# Patient Record
Sex: Male | Born: 1990 | Race: Black or African American | Hispanic: No | Marital: Single | Smoking: Current every day smoker
Health system: Southern US, Community
[De-identification: ages and names within clinical notes are randomized; demographics above are authoritative.]

## PROBLEM LIST (undated history)

## (undated) DIAGNOSIS — Z789 Other specified health status: Secondary | ICD-10-CM

## (undated) HISTORY — DX: Other specified health status: Z78.9

## (undated) HISTORY — PX: MOUTH SURGERY: SHX715

---

## 2010-09-12 ENCOUNTER — Emergency Department (HOSPITAL_BASED_OUTPATIENT_CLINIC_OR_DEPARTMENT_OTHER): Admission: EM | Admit: 2010-09-12 | Discharge: 2010-09-12 | Payer: Self-pay | Admitting: Emergency Medicine

## 2010-11-16 ENCOUNTER — Emergency Department (HOSPITAL_BASED_OUTPATIENT_CLINIC_OR_DEPARTMENT_OTHER)
Admission: EM | Admit: 2010-11-16 | Discharge: 2010-11-16 | Payer: Self-pay | Source: Home / Self Care | Admitting: Emergency Medicine

## 2011-08-28 ENCOUNTER — Inpatient Hospital Stay (INDEPENDENT_AMBULATORY_CARE_PROVIDER_SITE_OTHER)
Admission: RE | Admit: 2011-08-28 | Discharge: 2011-08-28 | Disposition: A | Discharge status: Home / Self Care | Payer: Self-pay | Source: Ambulatory Visit | Attending: Emergency Medicine | Admitting: Emergency Medicine

## 2011-08-28 DIAGNOSIS — J019 Acute sinusitis, unspecified: Secondary | ICD-10-CM

## 2011-10-10 ENCOUNTER — Emergency Department (HOSPITAL_BASED_OUTPATIENT_CLINIC_OR_DEPARTMENT_OTHER)
Admission: EM | Admit: 2011-10-10 | Discharge: 2011-10-10 | Disposition: A | Discharge status: Home / Self Care | Payer: Self-pay | Attending: Emergency Medicine | Admitting: Emergency Medicine

## 2011-10-10 DIAGNOSIS — F17200 Nicotine dependence, unspecified, uncomplicated: Secondary | ICD-10-CM | POA: Insufficient documentation

## 2011-10-10 DIAGNOSIS — K047 Periapical abscess without sinus: Secondary | ICD-10-CM | POA: Insufficient documentation

## 2011-10-10 DIAGNOSIS — R220 Localized swelling, mass and lump, head: Secondary | ICD-10-CM | POA: Insufficient documentation

## 2011-10-10 MED ORDER — IBUPROFEN 800 MG PO TABS: 800.0000 mg | ORAL_TABLET | Freq: Three times a day (TID) | ORAL | Status: AC

## 2011-10-10 MED ORDER — PENICILLIN V POTASSIUM 500 MG PO TABS: 500.0000 mg | ORAL_TABLET | Freq: Three times a day (TID) | ORAL | Status: AC

## 2011-10-10 NOTE — ED Provider Notes (Signed)
History     CSN: [id number] Arrival date & time: 10/10/2011  4:06 PM   First MD Initiated Contact with Patient 10/10/11 1607      Chief Complaint  Patient presents with  . Facial Swelling    (Consider location/radiation/quality/duration/timing/severity/associated sxs/prior treatment) HPI Comments: Patient presents with 3 weeks of right jaw swelling which she felt was slightly worse today which is why he presents.  He knows that he has a cavity in one of his right lower teeth and complaints at this intermittently hurts.  He's been taking Tylenol for pain.  No fevers no difficulty with breathing or swallowing.  Patient is a 20 y.o. male presenting with tooth pain.  Dental PainThe primary symptoms include mouth pain. Primary symptoms do not include dental injury, oral bleeding, oral lesions, headaches, fever, shortness of breath, sore throat, angioedema or cough. The symptoms began more than 1 week ago. The symptoms are waxing and waning. The symptoms are new. The symptoms occur intermittently.  Additional symptoms include: jaw pain and facial swelling. Additional symptoms do not include: dental sensitivity to temperature, gum swelling, gum tenderness, purulent gums, trismus, trouble swallowing, pain with swallowing, excessive salivation, dry mouth, taste disturbance, smell disturbance, drooling, ear pain, hearing loss, nosebleeds, swollen glands, goiter and fatigue. Medical issues include: smoking.    History reviewed. No pertinent past medical history.  History reviewed. No pertinent past surgical history.  No family history on file.  History  Substance Use Topics  . Smoking status: Current Everyday Smoker -- 0.5 packs/day    Types: Cigarettes  . Smokeless tobacco: Not on file  . Alcohol Use: No      Review of Systems  Constitutional: Negative.  Negative for fever, chills and fatigue.  HENT: Positive for facial swelling. Negative for hearing loss, ear pain, nosebleeds, sore  throat, drooling and trouble swallowing.   Eyes: Negative.  Negative for discharge and redness.  Respiratory: Negative.  Negative for cough and shortness of breath.   Cardiovascular: Negative.  Negative for chest pain.  Gastrointestinal: Negative.  Negative for nausea, vomiting and abdominal pain.  Genitourinary: Negative.  Negative for hematuria.  Musculoskeletal: Negative.  Negative for back pain.  Skin: Negative.  Negative for color change and rash.  Neurological: Negative for syncope and headaches.  Hematological: Negative.  Negative for adenopathy.  Psychiatric/Behavioral: Negative.  Negative for confusion.  All other systems reviewed and are negative.    Allergies  Review of patient's allergies indicates no known allergies.  Home Medications   Current Outpatient Rx  Name Route Sig Dispense Refill  . ACETAMINOPHEN 500 MG PO TABS Oral Take 1,000 mg by mouth once.        BP 112/67  Pulse 81  Temp(Src) 98.5 F (36.9 C) (Oral)  Resp 16  Ht 5\' 11"  (1.803 m)  Wt 180 lb (81.647 kg)  BMI 25.10 kg/m2  SpO2 100%  Physical Exam  Constitutional: He is oriented to person, place, and time. He appears well-developed and well-nourished.  Non-toxic appearance. He does not have a sickly appearance.  HENT:  Head: Normocephalic and atraumatic.  Mouth/Throat:         Mild right jaw swelling without erythema or warmth.  No fluctuance noted.  No crepitus noted.  Eyes: Conjunctivae, EOM and lids are normal. Pupils are equal, round, and reactive to light.  Neck: Trachea normal, normal range of motion and full passive range of motion without pain. Neck supple.  Cardiovascular: Normal rate, regular rhythm and normal heart sounds.  Pulmonary/Chest: Effort normal and breath sounds normal. No respiratory distress.  Abdominal: Normal appearance. There is no CVA tenderness.  Musculoskeletal: Normal range of motion.  Neurological: He is alert and oriented to person, place, and time. He has  normal strength.  Skin: Skin is warm, dry and intact. No rash noted.  Psychiatric: He has a normal mood and affect. His behavior is normal. Judgment and thought content normal.    ED Course  Procedures (including critical care time)  Labs Reviewed - No data to display No results found.   No diagnosis found.    MDM  Patient appears to have very mild swelling of his right jaw which may be from a early dental infection.  I will place the patient on penicillin and this point in time his pain is not significant to need more than ibuprofen 800 mg by mouth 3 times a day.  Patient has seen a dentist in September for previous dental issue and so has someone to followup.  He understands to return for any worsening swelling or other symptoms.  He has no difficulty with swallowing or breathing at this time.        Nat Christen, MD 10/10/11 1620

## 2011-10-10 NOTE — ED Notes (Signed)
Pt reports right sided facial swelling x 3 weeks and dental pain.

## 2011-11-10 ENCOUNTER — Emergency Department (INDEPENDENT_AMBULATORY_CARE_PROVIDER_SITE_OTHER): Payer: Self-pay

## 2011-11-10 ENCOUNTER — Encounter (HOSPITAL_BASED_OUTPATIENT_CLINIC_OR_DEPARTMENT_OTHER): Payer: Self-pay | Admitting: Family Medicine

## 2011-11-10 ENCOUNTER — Emergency Department (HOSPITAL_BASED_OUTPATIENT_CLINIC_OR_DEPARTMENT_OTHER)
Admission: EM | Admit: 2011-11-10 | Discharge: 2011-11-10 | Disposition: A | Discharge status: Home / Self Care | Payer: Self-pay | Attending: Emergency Medicine | Admitting: Emergency Medicine

## 2011-11-10 ENCOUNTER — Emergency Department (HOSPITAL_BASED_OUTPATIENT_CLINIC_OR_DEPARTMENT_OTHER): Payer: Self-pay

## 2011-11-10 DIAGNOSIS — X58XXXA Exposure to other specified factors, initial encounter: Secondary | ICD-10-CM | POA: Insufficient documentation

## 2011-11-10 DIAGNOSIS — F17200 Nicotine dependence, unspecified, uncomplicated: Secondary | ICD-10-CM | POA: Insufficient documentation

## 2011-11-10 DIAGNOSIS — M25569 Pain in unspecified knee: Secondary | ICD-10-CM

## 2011-11-10 DIAGNOSIS — X500XXA Overexertion from strenuous movement or load, initial encounter: Secondary | ICD-10-CM

## 2011-11-10 DIAGNOSIS — S8990XA Unspecified injury of unspecified lower leg, initial encounter: Secondary | ICD-10-CM | POA: Insufficient documentation

## 2011-11-10 MED ORDER — IBUPROFEN 800 MG PO TABS: 800.0000 mg | ORAL_TABLET | Freq: Three times a day (TID) | ORAL | Status: AC

## 2011-11-10 NOTE — ED Notes (Signed)
Pt states moving boxes last PM and used L knee to help push bos, L medial knee has hurt ever since. Pt repots icing knee last pm, no swelling noted.

## 2011-11-10 NOTE — ED Notes (Signed)
Pt c/o left knee pain since lifting boxes last evening. Pt able to ambulate.

## 2011-11-10 NOTE — ED Notes (Signed)
Patient is resting comfortably. States no worsening and feels less pain in knee since resting.

## 2011-11-10 NOTE — ED Provider Notes (Signed)
History     CSN: [id number] Arrival date & time: 11/10/2011 10:07 AM   First MD Initiated Contact with Patient 11/10/11 1016      Chief Complaint  Patient presents with  . Knee Pain    (Consider location/radiation/quality/duration/timing/severity/associated sxs/prior treatment) HPI Comments: Patient is a 20 year old man who said that yesterday he was moving a box with his left leg. He hurt his knee, and feels pain in the medial side of his left knee. There was no fall. Now when he stands and walks he feels pain on the medial side of the knee and when he makes a pivoting move he feels pain there also. There is no prior history of knee problems.  Patient is a 20 y.o. male presenting with knee pain.  Knee Pain This is a new problem. The current episode started yesterday. The problem occurs constantly. The problem has not changed since onset.The symptoms are aggravated by standing (Pain is worsened by making a pivoting move on his left leg.). He has tried a cold compress for the symptoms. The treatment provided mild relief.    History reviewed. No pertinent past medical history.  History reviewed. No pertinent past surgical history.  History reviewed. No pertinent family history.  History  Substance Use Topics  . Smoking status: Current Everyday Smoker -- 0.5 packs/day    Types: Cigarettes  . Smokeless tobacco: Not on file  . Alcohol Use: No      Review of Systems  All other systems reviewed and are negative.    Allergies  Review of patient's allergies indicates no known allergies.  Home Medications   Current Outpatient Rx  Name Route Sig Dispense Refill  . ACETAMINOPHEN 500 MG PO TABS Oral Take 1,000 mg by mouth once.        Ht 5\' 11"  (1.803 m)  Wt 175 lb (79.379 kg)  BMI 24.41 kg/m2  SpO2 100%  Physical Exam  Constitutional: He is oriented to person, place, and time. He appears well-developed and well-nourished. No distress.  HENT:  Head: Normocephalic.    Neck: Normal range of motion. Neck supple.  Musculoskeletal:       He localizes pain to the medial joint line of the left knee. There is no effusion. No ligamentous instability. There is no palpable bony deformity of the left knee. Range of motion is full left knee is full. He has intact pulses sensation and tendon function in the left foot.  Neurological: He is alert and oriented to person, place, and time.       No sensory or motor deficit.  Skin: Skin is warm and dry.  Psychiatric: He has a normal mood and affect. His behavior is normal.    ED Course  Procedures (including critical care time)   10:32 AM Patient was seen and had physical examination. X-ray left knee was ordered.   1. Knee injury           Carleene Cooper III, MD 11/10/11 2108

## 2012-01-15 ENCOUNTER — Emergency Department (HOSPITAL_BASED_OUTPATIENT_CLINIC_OR_DEPARTMENT_OTHER)
Admission: EM | Admit: 2012-01-15 | Discharge: 2012-01-15 | Disposition: A | Discharge status: Home Health | Payer: Self-pay | Attending: Emergency Medicine | Admitting: Emergency Medicine

## 2012-01-15 ENCOUNTER — Encounter (HOSPITAL_BASED_OUTPATIENT_CLINIC_OR_DEPARTMENT_OTHER): Payer: Self-pay | Admitting: Emergency Medicine

## 2012-01-15 DIAGNOSIS — R110 Nausea: Secondary | ICD-10-CM | POA: Insufficient documentation

## 2012-01-15 DIAGNOSIS — R51 Headache: Secondary | ICD-10-CM | POA: Insufficient documentation

## 2012-01-15 DIAGNOSIS — J069 Acute upper respiratory infection, unspecified: Secondary | ICD-10-CM | POA: Insufficient documentation

## 2012-01-15 DIAGNOSIS — F17200 Nicotine dependence, unspecified, uncomplicated: Secondary | ICD-10-CM | POA: Insufficient documentation

## 2012-01-15 MED ORDER — ONDANSETRON 4 MG PO TBDP: 4.0000 mg | ORAL_TABLET | Freq: Three times a day (TID) | ORAL | Status: AC | PRN

## 2012-01-15 NOTE — ED Notes (Signed)
Pt here for sinus pressure, pain around eyes, cough, and chills. Pt states he has also had nausea without vomiting.

## 2012-01-15 NOTE — ED Provider Notes (Signed)
Medical screening examination/treatment/procedure(s) were performed by non-physician practitioner and as supervising physician I was immediately available for consultation/collaboration.  Hurman Horn, MD 01/15/12 2236

## 2012-01-15 NOTE — ED Notes (Signed)
Pt c/o nausea and sinus pain x several days

## 2012-01-15 NOTE — ED Provider Notes (Signed)
History     CSN: [id number]  Arrival date & time 01/15/12  2046   First MD Initiated Contact with Patient 01/15/12 2101      Chief Complaint  Patient presents with  . Facial Pain  . Nausea    (Consider location/radiation/quality/duration/timing/severity/associated sxs/prior treatment) Patient is a 21 y.o. male presenting with URI. The history is provided by the patient. No language interpreter was used.  URI The primary symptoms include headaches and cough. Primary symptoms do not include fever. The current episode started 3 to 5 days ago. This is a new problem. The problem has not changed since onset. Symptoms associated with the illness include facial pain and congestion.    No past medical history on file.  History reviewed. No pertinent past surgical history.  No family history on file.  History  Substance Use Topics  . Smoking status: Current Everyday Smoker -- 0.5 packs/day    Types: Cigarettes  . Smokeless tobacco: Not on file  . Alcohol Use: No      Review of Systems  Constitutional: Negative for fever.  HENT: Positive for congestion.   Respiratory: Positive for cough.   Neurological: Positive for headaches.    Allergies  Review of patient's allergies indicates no known allergies.  Home Medications   Current Outpatient Rx  Name Route Sig Dispense Refill  . ACETAMINOPHEN 500 MG PO TABS Oral Take 1,000 mg by mouth once.      Marland Kitchen ONDANSETRON 4 MG PO TBDP Oral Take 1 tablet (4 mg total) by mouth every 8 (eight) hours as needed for nausea. 20 tablet 0    BP 116/70  Pulse 102  Temp(Src) 98.9 F (37.2 C) (Oral)  Resp 18  Ht 6' (1.829 m)  Wt 175 lb (79.379 kg)  BMI 23.73 kg/m2  SpO2 100%  Physical Exam  Nursing note and vitals reviewed. Constitutional: He is oriented to person, place, and time. He appears well-developed and well-nourished.  HENT:  Head: Normocephalic and atraumatic.  Right Ear: External ear normal.  Left Ear: External ear normal.    Nose: Rhinorrhea present.  Eyes: EOM are normal.  Neck: Neck supple.  Cardiovascular: Normal rate and regular rhythm.   Pulmonary/Chest: Effort normal and breath sounds normal.  Neurological: He is alert and oriented to person, place, and time.  Skin: Skin is warm and dry.  Psychiatric: He has a normal mood and affect.    ED Course  Procedures (including critical care time)  Labs Reviewed - No data to display No results found.   1. URI (upper respiratory infection)   2. Nausea       MDM  Don't thing pt needs antibiotics at this time        Teressa Lower, NP 01/15/12 2113

## 2012-10-03 ENCOUNTER — Emergency Department (HOSPITAL_BASED_OUTPATIENT_CLINIC_OR_DEPARTMENT_OTHER)
Admission: EM | Admit: 2012-10-03 | Discharge: 2012-10-03 | Disposition: A | Discharge status: Home / Self Care | Payer: Self-pay | Attending: Emergency Medicine | Admitting: Emergency Medicine

## 2012-10-03 ENCOUNTER — Encounter (HOSPITAL_BASED_OUTPATIENT_CLINIC_OR_DEPARTMENT_OTHER): Payer: Self-pay | Admitting: *Deleted

## 2012-10-03 ENCOUNTER — Emergency Department (HOSPITAL_BASED_OUTPATIENT_CLINIC_OR_DEPARTMENT_OTHER): Payer: Self-pay

## 2012-10-03 DIAGNOSIS — S61439A Puncture wound without foreign body of unspecified hand, initial encounter: Secondary | ICD-10-CM

## 2012-10-03 DIAGNOSIS — W1789XA Other fall from one level to another, initial encounter: Secondary | ICD-10-CM | POA: Insufficient documentation

## 2012-10-03 DIAGNOSIS — Y929 Unspecified place or not applicable: Secondary | ICD-10-CM | POA: Insufficient documentation

## 2012-10-03 DIAGNOSIS — F17200 Nicotine dependence, unspecified, uncomplicated: Secondary | ICD-10-CM | POA: Insufficient documentation

## 2012-10-03 DIAGNOSIS — S61409A Unspecified open wound of unspecified hand, initial encounter: Secondary | ICD-10-CM | POA: Insufficient documentation

## 2012-10-03 DIAGNOSIS — Y9301 Activity, walking, marching and hiking: Secondary | ICD-10-CM | POA: Insufficient documentation

## 2012-10-03 MED ORDER — AMOXICILLIN-POT CLAVULANATE 875-125 MG PO TABS: 1.0000 | ORAL_TABLET | Freq: Two times a day (BID) | ORAL | Status: DC

## 2012-10-03 MED ORDER — AMOXICILLIN-POT CLAVULANATE 875-125 MG PO TABS
1.0000 | ORAL_TABLET | Freq: Once | ORAL | Status: AC
  Administered 2012-10-03: 1 via ORAL
  Filled 2012-10-03: qty 1

## 2012-10-03 NOTE — ED Notes (Signed)
Tripped caught self with right hand states painful and limited movement

## 2012-10-03 NOTE — ED Notes (Signed)
MD at bedside. 

## 2012-10-03 NOTE — ED Provider Notes (Signed)
History     CSN: [id number]  Arrival date & time 10/03/12  5784   First MD Initiated Contact with Patient 10/03/12 [phone]      Chief Complaint  Patient presents with  . Hand Pain    (Consider location/radiation/quality/duration/timing/severity/associated sxs/prior treatment) HPI Pt reports he tripped and fell yesterday walking on some wooden steps. He landed on a nail which punctured the palm of his R hand. Pt did not initially have any pain, but today reports moderate aching pain worse with movement of his R 4th finger.   History reviewed. No pertinent past medical history.  History reviewed. No pertinent past surgical history.  History reviewed. No pertinent family history.  History  Substance Use Topics  . Smoking status: Current Every Day Smoker -- 0.5 packs/day    Types: Cigarettes  . Smokeless tobacco: Not on file  . Alcohol Use: No      Review of Systems All other systems reviewed and are negative except as noted in HPI.   Allergies  Review of patient's allergies indicates no known allergies.  Home Medications   Current Outpatient Rx  Name Route Sig Dispense Refill  . ACETAMINOPHEN 500 MG PO TABS Oral Take 1,000 mg by mouth once.        BP 133/80  Pulse 78  Temp 97.8 F (36.6 C) (Oral)  Resp 20  Ht 5\' 11"  (1.803 m)  Wt 160 lb (72.576 kg)  BMI 22.32 kg/m2  SpO2 99%  Physical Exam  Constitutional: He is oriented to person, place, and time. He appears well-developed and well-nourished.  HENT:  Head: Normocephalic and atraumatic.  Neck: Neck supple.  Pulmonary/Chest: Effort normal.  Musculoskeletal: Normal range of motion. He exhibits tenderness.       Soft tissue tenderness over the R palm at the site of a puncture over the distal head of the 4th metacarpal. No snuffbox tenderness, no significant swelling, erythema or drainage  Neurological: He is alert and oriented to person, place, and time. No cranial nerve deficit.  Psychiatric: He has a normal  mood and affect. His behavior is normal.    ED Course  Procedures (including critical care time)  Labs Reviewed - No data to display Dg Hand Complete Right  10/03/2012  *RADIOLOGY REPORT*  Clinical Data: Stuck in palm of hand with a nail  RIGHT HAND - COMPLETE 3+ VIEW  Comparison: None.  Findings: No fracture or dislocation is seen.  The joint spaces are preserved.  The visualized soft tissues are unremarkable.  No radiopaque foreign body is seen.  IMPRESSION: No fracture, dislocation, or radiopaque foreign body is seen.   Original Report Authenticated By: Charline Bills, M.D.      No diagnosis found.    MDM  Xray neg for foreign body as above. Pt declines pain medication. Wound too remote and already closed, cannot irrigate in the ED. Will Rx Abx and advised Hand followup.         Charles B. Bernette Mayers, MD 10/03/12 [phone]

## 2012-11-03 ENCOUNTER — Emergency Department (HOSPITAL_BASED_OUTPATIENT_CLINIC_OR_DEPARTMENT_OTHER)
Admission: EM | Admit: 2012-11-03 | Discharge: 2012-11-03 | Disposition: A | Discharge status: Home / Self Care | Payer: Self-pay | Attending: Emergency Medicine | Admitting: Emergency Medicine

## 2012-11-03 ENCOUNTER — Encounter (HOSPITAL_BASED_OUTPATIENT_CLINIC_OR_DEPARTMENT_OTHER): Payer: Self-pay | Admitting: Emergency Medicine

## 2012-11-03 DIAGNOSIS — R197 Diarrhea, unspecified: Secondary | ICD-10-CM | POA: Insufficient documentation

## 2012-11-03 DIAGNOSIS — F17200 Nicotine dependence, unspecified, uncomplicated: Secondary | ICD-10-CM | POA: Insufficient documentation

## 2012-11-03 DIAGNOSIS — A084 Viral intestinal infection, unspecified: Secondary | ICD-10-CM

## 2012-11-03 DIAGNOSIS — A088 Other specified intestinal infections: Secondary | ICD-10-CM | POA: Insufficient documentation

## 2012-11-03 LAB — URINALYSIS, ROUTINE W REFLEX MICROSCOPIC
Bilirubin Urine: NEGATIVE
Nitrite: NEGATIVE
Specific Gravity, Urine: 1.019 (ref 1.005–1.030)
pH: 6 (ref 5.0–8.0)

## 2012-11-03 NOTE — ED Provider Notes (Signed)
Medical screening examination/treatment/procedure(s) were performed by non-physician practitioner and as supervising physician I was immediately available for consultation/collaboration.  Geoffery Lyons, MD 11/03/12 [phone]

## 2012-11-03 NOTE — ED Notes (Signed)
Patient was able to keep fluid and crackers down w/o vomiting

## 2012-11-03 NOTE — ED Notes (Signed)
Pt having N/V/D since Thursday.

## 2012-11-03 NOTE — ED Provider Notes (Signed)
History     CSN: [id number]  Arrival date & time 11/03/12  1217   First MD Initiated Contact with Patient 11/03/12 1332      Chief Complaint  Patient presents with  . Emesis  . Diarrhea    (Consider location/radiation/quality/duration/timing/severity/associated sxs/prior treatment) HPI Comments: 21 year old male presents to the emergency department complaining of nausea, vomiting and diarrhea beginning on Friday night. States he drank more so than normal on Friday, and since then his stomach has felt "gassy". He had a minor stomach ache which Pepto-Bismol provided complete relief. States his vomiting and diarrhea are not constant. He has not had a normal bowel movement since Friday. He was able to keep toast and a pear down this morning. Denies fever or chills. Currently not nauseated.  Patient is a 21 y.o. male presenting with vomiting and diarrhea. The history is provided by the patient.  Emesis  Associated symptoms include diarrhea. Pertinent negatives include no abdominal pain, no chills, no fever and no headaches.  Diarrhea The primary symptoms include vomiting and diarrhea. Primary symptoms do not include fever, abdominal pain or nausea.  The illness does not include chills or back pain.    No past medical history on file.  No past surgical history on file.  No family history on file.  History  Substance Use Topics  . Smoking status: Current Every Day Smoker -- 0.5 packs/day    Types: Cigarettes  . Smokeless tobacco: Not on file  . Alcohol Use: No      Review of Systems  Constitutional: Negative for fever, chills and appetite change.  HENT: Negative.   Respiratory: Negative for shortness of breath.   Cardiovascular: Negative for chest pain.  Gastrointestinal: Positive for vomiting and diarrhea. Negative for nausea and abdominal pain.  Genitourinary: Negative.   Musculoskeletal: Negative for back pain.  Skin: Negative.   Neurological: Negative for dizziness,  light-headedness and headaches.  Psychiatric/Behavioral: Negative for confusion.    Allergies  Review of patient's allergies indicates no known allergies.  Home Medications  No current outpatient prescriptions on file.  BP 101/65  Pulse 80  Temp 97.9 F (36.6 C) (Oral)  Resp 18  SpO2 100%  Physical Exam  Nursing note and vitals reviewed. Constitutional: He is oriented to person, place, and time. He appears well-developed and well-nourished. No distress.       Appears comfortable.   HENT:  Head: Normocephalic and atraumatic.  Mouth/Throat: Oropharynx is clear and moist.  Eyes: Conjunctivae normal are normal.  Neck: Normal range of motion. Neck supple.  Cardiovascular: Normal rate, regular rhythm, normal heart sounds and intact distal pulses.   Pulmonary/Chest: Effort normal and breath sounds normal.  Abdominal: Soft. Bowel sounds are normal. There is no tenderness.  Musculoskeletal: Normal range of motion. He exhibits no edema.  Neurological: He is alert and oriented to person, place, and time.  Skin: Skin is warm and dry.  Psychiatric: He has a normal mood and affect. His behavior is normal.    ED Course  Procedures (including critical care time)   Labs Reviewed  URINALYSIS, ROUTINE W REFLEX MICROSCOPIC   No results found.   1. Viral gastroenteritis       MDM  Your viral gastroenteritis. Currently he room in no distress appearing completely comfortable. Currently not nauseated no abdominal pain or tenderness on exam. He was able to eat crackers and drink soda without any difficulty. Discussed BRAT diet. Stable for discharge.        Nada Boozer  Mathis Fare, PA-C 11/03/12 1441

## 2013-02-13 ENCOUNTER — Encounter (HOSPITAL_BASED_OUTPATIENT_CLINIC_OR_DEPARTMENT_OTHER): Payer: Self-pay | Admitting: *Deleted

## 2013-02-13 ENCOUNTER — Emergency Department (HOSPITAL_BASED_OUTPATIENT_CLINIC_OR_DEPARTMENT_OTHER)
Admission: EM | Admit: 2013-02-13 | Discharge: 2013-02-13 | Disposition: A | Discharge status: Home / Self Care | Payer: BC Managed Care – PPO | Attending: Emergency Medicine | Admitting: Emergency Medicine

## 2013-02-13 ENCOUNTER — Emergency Department (HOSPITAL_BASED_OUTPATIENT_CLINIC_OR_DEPARTMENT_OTHER): Payer: BC Managed Care – PPO

## 2013-02-13 DIAGNOSIS — Y929 Unspecified place or not applicable: Secondary | ICD-10-CM | POA: Insufficient documentation

## 2013-02-13 DIAGNOSIS — X58XXXA Exposure to other specified factors, initial encounter: Secondary | ICD-10-CM | POA: Insufficient documentation

## 2013-02-13 DIAGNOSIS — Y9389 Activity, other specified: Secondary | ICD-10-CM | POA: Insufficient documentation

## 2013-02-13 DIAGNOSIS — S91332A Puncture wound without foreign body, left foot, initial encounter: Secondary | ICD-10-CM

## 2013-02-13 DIAGNOSIS — F17200 Nicotine dependence, unspecified, uncomplicated: Secondary | ICD-10-CM | POA: Insufficient documentation

## 2013-02-13 DIAGNOSIS — S91309A Unspecified open wound, unspecified foot, initial encounter: Secondary | ICD-10-CM | POA: Insufficient documentation

## 2013-02-13 MED ORDER — AMOXICILLIN-POT CLAVULANATE 875-125 MG PO TABS: 1.0000 | ORAL_TABLET | Freq: Two times a day (BID) | ORAL | Status: DC

## 2013-02-13 MED ORDER — HYDROCODONE-ACETAMINOPHEN 5-325 MG PO TABS: 2.0000 | ORAL_TABLET | ORAL | Status: DC | PRN

## 2013-02-13 MED ORDER — AMOXICILLIN-POT CLAVULANATE 875-125 MG PO TABS
1.0000 | ORAL_TABLET | Freq: Once | ORAL | Status: AC
  Administered 2013-02-13: 1 via ORAL
  Filled 2013-02-13: qty 1

## 2013-02-13 NOTE — ED Notes (Signed)
Left foot injury. He stepped on a Christmas tree stand last night. Pain since.

## 2013-02-13 NOTE — ED Provider Notes (Signed)
History     CSN: [id number]  Arrival date & time 02/13/13  1444   First MD Initiated Contact with Patient 02/13/13 1502      Chief Complaint  Patient presents with  . Foot Injury    (Consider location/radiation/quality/duration/timing/severity/associated sxs/prior treatment) HPI Comments: Patient sustained a puncture wound to left foot last night. He stepped on a spike Christmas tree stand. He was wearing a shoe and sock at that time. He's had pain since. Denies any weakness, numbness or tingling. Denies any bleeding or drainage. Last tetanus shot 2010.  The history is provided by the patient.    History reviewed. No pertinent past medical history.  History reviewed. No pertinent past surgical history.  No family history on file.  History  Substance Use Topics  . Smoking status: Current Every Day Smoker -- 1.00 packs/day    Types: Cigarettes  . Smokeless tobacco: Not on file  . Alcohol Use: No      Review of Systems  Constitutional: Negative for activity change and appetite change.  Respiratory: Negative for chest tightness and shortness of breath.   Gastrointestinal: Negative for nausea, vomiting and abdominal pain.  Musculoskeletal: Negative for back pain.  Skin: Positive for wound.  Neurological: Negative for headaches.  A complete 10 system review of systems was obtained and all systems are negative except as noted in the HPI and PMH.    Allergies  Review of patient's allergies indicates no known allergies.  Home Medications   Current Outpatient Rx  Name  Route  Sig  Dispense  Refill  . amoxicillin-clavulanate (AUGMENTIN) 875-125 MG per tablet   Oral   Take 1 tablet by mouth every 12 (twelve) hours.   14 tablet   0   . HYDROcodone-acetaminophen (NORCO/VICODIN) 5-325 MG per tablet   Oral   Take 2 tablets by mouth every 4 (four) hours as needed for pain.   10 tablet   0     BP 116/63  Pulse 86  Temp(Src) 98.4 F (36.9 C) (Oral)  Resp 18  Wt  160 lb (72.576 kg)  BMI 22.33 kg/m2  SpO2 99%  Physical Exam  Constitutional: He is oriented to person, place, and time. He appears well-developed and well-nourished. No distress.  Cardiovascular: Normal rate, regular rhythm and normal heart sounds.   No murmur heard. Pulmonary/Chest: Effort normal and breath sounds normal. No respiratory distress.  Abdominal: Soft. There is no tenderness. There is no rebound and no guarding.  Musculoskeletal: Normal range of motion. He exhibits no edema and no tenderness.  Puncture wound to sole of L foot. Does not penetrate dorsal surface +2 DP and PT pulse. Able to wiggle toes. Sensation intact.    Neurological: He is alert and oriented to person, place, and time. No cranial nerve deficit. He exhibits normal muscle tone. Coordination normal.  Skin: Skin is warm.    ED Course  Procedures (including critical care time)  Labs Reviewed - No data to display Dg Foot Complete Left  02/13/2013  *RADIOLOGY REPORT*  Clinical Data: Foot injury.  LEFT FOOT - COMPLETE 3+ VIEW  Comparison: None.  Findings: No acute bony abnormality.  Specifically, no fracture, subluxation, or dislocation.  Soft tissues are intact. Joint spaces are maintained.  Normal bone mineralization. No radiopaque foreign body.  IMPRESSION: No bony abnormality.   Original Report Authenticated By: Charlett Nose, M.D.      1. Puncture wound of foot, left, initial encounter       MDM  Puncture  wound to sole of left foot since last night. Neurovascularly intact. Tetanus up-to-date.  Obtain x-ray to rule out foreign body. Clean wound. Empiric antibiotics.  X-ray negative for foreign body or fracture. Patient given postop shoe, antibiotics, pain medication, wound care instructions. Return precautions discussed.      Glynn Octave, MD 02/13/13 [phone]

## 2013-06-09 ENCOUNTER — Emergency Department (HOSPITAL_BASED_OUTPATIENT_CLINIC_OR_DEPARTMENT_OTHER)
Admission: EM | Admit: 2013-06-09 | Discharge: 2013-06-09 | Disposition: A | Discharge status: Home / Self Care | Payer: Self-pay | Attending: Emergency Medicine | Admitting: Emergency Medicine

## 2013-06-09 ENCOUNTER — Encounter (HOSPITAL_BASED_OUTPATIENT_CLINIC_OR_DEPARTMENT_OTHER): Payer: Self-pay | Admitting: *Deleted

## 2013-06-09 ENCOUNTER — Emergency Department (HOSPITAL_BASED_OUTPATIENT_CLINIC_OR_DEPARTMENT_OTHER): Payer: Self-pay

## 2013-06-09 DIAGNOSIS — M25569 Pain in unspecified knee: Secondary | ICD-10-CM | POA: Insufficient documentation

## 2013-06-09 DIAGNOSIS — F17200 Nicotine dependence, unspecified, uncomplicated: Secondary | ICD-10-CM | POA: Insufficient documentation

## 2013-06-09 DIAGNOSIS — M25561 Pain in right knee: Secondary | ICD-10-CM

## 2013-06-09 MED ORDER — IBUPROFEN 800 MG PO TABS: 800.0000 mg | ORAL_TABLET | Freq: Three times a day (TID) | ORAL | Status: DC

## 2013-06-09 NOTE — ED Notes (Signed)
Right knee pain

## 2013-06-09 NOTE — ED Provider Notes (Signed)
History    CSN: [id number] Arrival date & time 06/09/13  1136  First MD Initiated Contact with Patient 06/09/13 1321     Chief Complaint  Patient presents with  . Knee Pain   (Consider location/radiation/quality/duration/timing/severity/associated sxs/prior Treatment) Patient is a 22 y.o. male presenting with knee pain. The history is provided by the patient. No language interpreter was used.  Knee Pain Location:  Knee Time since incident: Patient says that about a year ago he had strained right medial collateral ligament. Had healed, for about a month and a half he had a pain intermittently in the right knee, fell over the lateral aspect of his knee and behind the patella. Injury: no   Knee location:  R knee Pain details:    Quality: The pain is an aching pain, and was worse yesterday after driving a long way.   Radiates to:  Does not radiate   Severity:  Moderate   Onset quality:  Gradual   Duration:  1 month   Timing:  Intermittent   Progression:  Worsening Chronicity:  Recurrent Dislocation: no   Foreign body present:  No foreign bodies Prior injury to area:  Yes Relieved by:  Nothing (He has taken over-the-counter ibuprofen intermittently, but still continues with pain.) Worsened by:  Nothing tried Ineffective treatments:  None tried Associated symptoms: no fever    History reviewed. No pertinent past medical history. History reviewed. No pertinent past surgical history. No family history on file. History  Substance Use Topics  . Smoking status: Current Every Day Smoker -- 1.00 packs/day    Types: Cigarettes  . Smokeless tobacco: Not on file  . Alcohol Use: No    Review of Systems  Constitutional: Negative.  Negative for fever and chills.  HENT: Negative.   Eyes: Negative.   Respiratory: Negative.   Cardiovascular: Negative.   Gastrointestinal: Negative.   Genitourinary: Negative.   Musculoskeletal:       Pain in the right knee.  Skin: Negative.    Neurological: Negative.   Psychiatric/Behavioral: Negative.     Allergies  Review of patient's allergies indicates no known allergies.  Home Medications   Current Outpatient Rx  Name  Route  Sig  Dispense  Refill  . amoxicillin-clavulanate (AUGMENTIN) 875-125 MG per tablet   Oral   Take 1 tablet by mouth every 12 (twelve) hours.   14 tablet   0   . HYDROcodone-acetaminophen (NORCO/VICODIN) 5-325 MG per tablet   Oral   Take 2 tablets by mouth every 4 (four) hours as needed for pain.   10 tablet   0   . ibuprofen (ADVIL,MOTRIN) 800 MG tablet   Oral   Take 1 tablet (800 mg total) by mouth 3 (three) times daily.   15 tablet   0    BP 134/71  Pulse 72  Temp(Src) 98.6 F (37 C) (Oral)  Resp 16  Ht 5\' 11"  (1.803 m)  Wt 165 lb (74.844 kg)  BMI 23.02 kg/m2 Physical Exam  Nursing note and vitals reviewed. Constitutional: He is oriented to person, place, and time. He appears well-developed and well-nourished. No distress.  Appears to enjoy robust good health.  HENT:  Head: Normocephalic and atraumatic.  Neck: Normal range of motion. Neck supple.  Musculoskeletal:  The right knee has no effusion. There is no palpable bony deformity or point tenderness. He localizes the pain to the right lateral knee over the lateral collateral ligament, and the area around the patella. Range of motion is  of the right knee is full without any clicks or producing any pain. He has intact pulses sensation and tendon function in the right foot. There is no calf tenderness and no Homans' sign.  Neurological: He is alert and oriented to person, place, and time.  No sensory or motor deficit.  Skin: Skin is warm and dry.  Psychiatric: He has a normal mood and affect. His behavior is normal.    ED Course  Procedures (including critical care time) Labs Reviewed - No data to display Dg Knee 1-2 Views Right  06/09/2013   *RADIOLOGY REPORT*  Clinical Data: Right knee chronic pain  RIGHT KNEE - 1-2  VIEW  Comparison: None.  Findings: Two views of the right knee submitted.  No acute fracture or subluxation.  No joint effusion.  IMPRESSION: No acute fracture or subluxation.   Original Report Authenticated By: Natasha Mead, M.D.    Patient's exam and x-ray were negative. I advised him to wear a knee sleeve on, to take ibuprofen 800 mg 3 times a day with food or milk to avoid gastric upset. If after a week of this therapy he is not better, he will need to be seen here for a sports medicine orthopedic doctor. He may need to have MRI of the knee or arthroscopy if he doesn't improve.    1. Right knee pain       Carleene Cooper III, MD 06/09/13 1348

## 2013-06-16 ENCOUNTER — Encounter (HOSPITAL_BASED_OUTPATIENT_CLINIC_OR_DEPARTMENT_OTHER): Payer: Self-pay | Admitting: Family Medicine

## 2013-06-16 ENCOUNTER — Emergency Department (HOSPITAL_BASED_OUTPATIENT_CLINIC_OR_DEPARTMENT_OTHER)
Admission: EM | Admit: 2013-06-16 | Discharge: 2013-06-16 | Disposition: A | Discharge status: Home / Self Care | Payer: Self-pay | Attending: Emergency Medicine | Admitting: Emergency Medicine

## 2013-06-16 DIAGNOSIS — L509 Urticaria, unspecified: Secondary | ICD-10-CM | POA: Insufficient documentation

## 2013-06-16 DIAGNOSIS — F17200 Nicotine dependence, unspecified, uncomplicated: Secondary | ICD-10-CM | POA: Insufficient documentation

## 2013-06-16 MED ORDER — PREDNISONE 10 MG PO TABS: 20.0000 mg | ORAL_TABLET | Freq: Two times a day (BID) | ORAL | Status: DC

## 2013-06-16 NOTE — ED Notes (Signed)
Pt c/o rash to torso and upper legs x "several days".

## 2013-06-16 NOTE — ED Provider Notes (Signed)
   History    CSN: [id number] Arrival date & time 06/16/13  4696  First MD Initiated Contact with Patient 06/16/13 [phone]     Chief Complaint  Patient presents with  . Rash   (Consider location/radiation/quality/duration/timing/severity/associated sxs/prior Treatment) HPI Comments: Patient presents with itchy rash to the torso, and the insides of both thighs.  No new exposures, medication, or contacts.  Patient is a 22 y.o. male presenting with rash. The history is provided by the patient.  Rash Pain location: torso and both legs. Pain quality comment:  No pain, itchy Pain severity:  No pain Onset quality:  Gradual Duration:  1 week Timing:  Constant Progression:  Worsening Chronicity:  New Relieved by:  Nothing Worsened by:  Nothing tried Ineffective treatments:  None tried  History reviewed. No pertinent past medical history. History reviewed. No pertinent past surgical history. No family history on file. History  Substance Use Topics  . Smoking status: Current Every Day Smoker -- 1.00 packs/day    Types: Cigarettes  . Smokeless tobacco: Not on file  . Alcohol Use: No    Review of Systems  Skin: Positive for rash.  All other systems reviewed and are negative.    Allergies  Review of patient's allergies indicates no known allergies.  Home Medications   Current Outpatient Rx  Name  Route  Sig  Dispense  Refill  . amoxicillin-clavulanate (AUGMENTIN) 875-125 MG per tablet   Oral   Take 1 tablet by mouth every 12 (twelve) hours.   14 tablet   0   . HYDROcodone-acetaminophen (NORCO/VICODIN) 5-325 MG per tablet   Oral   Take 2 tablets by mouth every 4 (four) hours as needed for pain.   10 tablet   0   . ibuprofen (ADVIL,MOTRIN) 800 MG tablet   Oral   Take 1 tablet (800 mg total) by mouth 3 (three) times daily.   15 tablet   0    BP 120/71  Pulse 64  Temp(Src) 98.1 F (36.7 C) (Oral)  Resp 16  Ht 5\' 11"  (1.803 m)  Wt 165 lb (74.844 kg)  BMI 23.02  kg/m2  SpO2 100% Physical Exam  Nursing note and vitals reviewed. Constitutional: He is oriented to person, place, and time. He appears well-developed and well-nourished. No distress.  HENT:  Head: Normocephalic and atraumatic.  Mouth/Throat: Oropharynx is clear and moist.  Neck: Normal range of motion. Neck supple.  Musculoskeletal: Normal range of motion. He exhibits no edema.  Neurological: He is alert and oriented to person, place, and time.  Skin: Skin is warm and dry. Rash noted. He is not diaphoretic.  There is a raised, urticarial, pruritic rash to the left and right torso and the insides of both thighs.      ED Course  Procedures (including critical care time) Labs Reviewed - No data to display No results found. No diagnosis found.  MDM  Appears allergic in nature.  Will treat with prednisone and benadryl.  Geoffery Lyons, MD 06/16/13 [phone]

## 2013-11-05 ENCOUNTER — Emergency Department (HOSPITAL_BASED_OUTPATIENT_CLINIC_OR_DEPARTMENT_OTHER)
Admission: EM | Admit: 2013-11-05 | Discharge: 2013-11-05 | Disposition: A | Discharge status: Home / Self Care | Payer: BC Managed Care – PPO | Attending: Emergency Medicine | Admitting: Emergency Medicine

## 2013-11-05 ENCOUNTER — Encounter (HOSPITAL_BASED_OUTPATIENT_CLINIC_OR_DEPARTMENT_OTHER): Payer: Self-pay | Admitting: Emergency Medicine

## 2013-11-05 DIAGNOSIS — IMO0001 Reserved for inherently not codable concepts without codable children: Secondary | ICD-10-CM | POA: Insufficient documentation

## 2013-11-05 DIAGNOSIS — F17200 Nicotine dependence, unspecified, uncomplicated: Secondary | ICD-10-CM | POA: Insufficient documentation

## 2013-11-05 DIAGNOSIS — M533 Sacrococcygeal disorders, not elsewhere classified: Secondary | ICD-10-CM | POA: Insufficient documentation

## 2013-11-05 DIAGNOSIS — Z792 Long term (current) use of antibiotics: Secondary | ICD-10-CM | POA: Insufficient documentation

## 2013-11-05 DIAGNOSIS — Z791 Long term (current) use of non-steroidal anti-inflammatories (NSAID): Secondary | ICD-10-CM | POA: Insufficient documentation

## 2013-11-05 DIAGNOSIS — IMO0002 Reserved for concepts with insufficient information to code with codable children: Secondary | ICD-10-CM | POA: Insufficient documentation

## 2013-11-05 DIAGNOSIS — M549 Dorsalgia, unspecified: Secondary | ICD-10-CM

## 2013-11-05 LAB — URINALYSIS, ROUTINE W REFLEX MICROSCOPIC
Bilirubin Urine: NEGATIVE
Glucose, UA: NEGATIVE mg/dL
Hgb urine dipstick: NEGATIVE
Ketones, ur: 15 mg/dL — AB
Leukocytes, UA: NEGATIVE
Nitrite: NEGATIVE
Protein, ur: NEGATIVE mg/dL
Specific Gravity, Urine: 1.021 (ref 1.005–1.030)
Urobilinogen, UA: 1 mg/dL (ref 0.0–1.0)
pH: 6.5 (ref 5.0–8.0)

## 2013-11-05 MED ORDER — MELOXICAM 7.5 MG PO TABS: 7.5000 mg | ORAL_TABLET | Freq: Every day | ORAL | Status: DC

## 2013-11-05 MED ORDER — METHOCARBAMOL 500 MG PO TABS: 500.0000 mg | ORAL_TABLET | Freq: Two times a day (BID) | ORAL | Status: DC

## 2013-11-05 MED ORDER — OXYCODONE-ACETAMINOPHEN 5-325 MG PO TABS: 1.0000 | ORAL_TABLET | Freq: Once | ORAL | Status: DC

## 2013-11-05 NOTE — ED Provider Notes (Signed)
Medical screening examination/treatment/procedure(s) were performed by non-physician practitioner and as supervising physician I was immediately available for consultation/collaboration.  EKG Interpretation   None         Kaytelyn Glore, MD 11/05/13 2344 

## 2013-11-05 NOTE — ED Provider Notes (Signed)
CSN: [id number]     Arrival date & time 11/05/13  1847 History   First MD Initiated Contact with Patient 11/05/13 1918     Chief Complaint  Patient presents with  . Flank Pain   (Consider location/radiation/quality/duration/timing/severity/associated sxs/prior Treatment) Patient is a 22 y.o. male presenting with flank pain. The history is provided by the patient. No language interpreter was used.  Flank Pain This is a new problem. Episode onset: 3 days. The problem occurs constantly. The problem has been gradually worsening. Associated symptoms include myalgias. Pertinent negatives include no abdominal pain. Nothing aggravates the symptoms. He has tried nothing for the symptoms. The treatment provided moderate relief.    History reviewed. No pertinent past medical history. History reviewed. No pertinent past surgical history. No family history on file. History  Substance Use Topics  . Smoking status: Current Every Day Smoker -- 1.00 packs/day    Types: Cigarettes  . Smokeless tobacco: Not on file  . Alcohol Use: No    Review of Systems  Gastrointestinal: Negative for abdominal pain.  Genitourinary: Positive for flank pain.  Musculoskeletal: Positive for back pain and myalgias.  All other systems reviewed and are negative.    Allergies  Review of patient's allergies indicates no known allergies.  Home Medications   Current Outpatient Rx  Name  Route  Sig  Dispense  Refill  . amoxicillin-clavulanate (AUGMENTIN) 875-125 MG per tablet   Oral   Take 1 tablet by mouth every 12 (twelve) hours.   14 tablet   0   . HYDROcodone-acetaminophen (NORCO/VICODIN) 5-325 MG per tablet   Oral   Take 2 tablets by mouth every 4 (four) hours as needed for pain.   10 tablet   0   . ibuprofen (ADVIL,MOTRIN) 800 MG tablet   Oral   Take 1 tablet (800 mg total) by mouth 3 (three) times daily.   15 tablet   0   . meloxicam (MOBIC) 7.5 MG tablet   Oral   Take 1 tablet (7.5 mg total) by  mouth daily.   10 tablet   1   . methocarbamol (ROBAXIN) 500 MG tablet   Oral   Take 1 tablet (500 mg total) by mouth 2 (two) times daily.   20 tablet   0   . predniSONE (DELTASONE) 10 MG tablet   Oral   Take 2 tablets (20 mg total) by mouth 2 (two) times daily.   20 tablet   0    BP 112/60  Pulse 90  Temp(Src) 98.6 F (37 C) (Oral)  Resp 16  Ht 5\' 11"  (1.803 m)  Wt 160 lb (72.576 kg)  BMI 22.33 kg/m2  SpO2 98% Physical Exam  Constitutional: He is oriented to person, place, and time. He appears well-developed and well-nourished.  HENT:  Head: Normocephalic.  Eyes: Conjunctivae and EOM are normal. Pupils are equal, round, and reactive to light.  Pulmonary/Chest: Effort normal.  Abdominal: Soft.  Musculoskeletal: He exhibits tenderness.  Tender ls spine,  Tender paravertebral areas bilat and tender sacral area  Neurological: He is alert and oriented to person, place, and time. He has normal reflexes.  Skin: Skin is warm.  Psychiatric: He has a normal mood and affect.    ED Course  Procedures (including critical care time) Labs Review Labs Reviewed  URINALYSIS, ROUTINE W REFLEX MICROSCOPIC - Abnormal; Notable for the following:    Ketones, ur 15 (*)    All other components within normal limits   Imaging Review No  results found.  EKG Interpretation   None       MDM   1. Back pain    meloxicam and robaxin    Elson Areas, PA-C 11/05/13 1951

## 2013-11-05 NOTE — ED Notes (Signed)
Pt complains of bilateral flank pain that started on Monday.  Pt denies painful urination or fevers.  Denies n/v

## 2013-11-05 NOTE — ED Notes (Addendum)
Pt asleep on stretcher-easily awakened-reviewed d/c instructions-pt requested RTW noted-EDNP notified-orders received

## 2014-03-10 ENCOUNTER — Emergency Department (HOSPITAL_BASED_OUTPATIENT_CLINIC_OR_DEPARTMENT_OTHER)
Admission: EM | Admit: 2014-03-10 | Discharge: 2014-03-11 | Disposition: A | Discharge status: Home / Self Care | Payer: BC Managed Care – PPO | Attending: Emergency Medicine | Admitting: Emergency Medicine

## 2014-03-10 ENCOUNTER — Encounter (HOSPITAL_BASED_OUTPATIENT_CLINIC_OR_DEPARTMENT_OTHER): Payer: Self-pay | Admitting: Emergency Medicine

## 2014-03-10 ENCOUNTER — Emergency Department (HOSPITAL_BASED_OUTPATIENT_CLINIC_OR_DEPARTMENT_OTHER): Payer: BC Managed Care – PPO

## 2014-03-10 DIAGNOSIS — M25469 Effusion, unspecified knee: Secondary | ICD-10-CM | POA: Insufficient documentation

## 2014-03-10 DIAGNOSIS — F17200 Nicotine dependence, unspecified, uncomplicated: Secondary | ICD-10-CM | POA: Insufficient documentation

## 2014-03-10 DIAGNOSIS — M25569 Pain in unspecified knee: Secondary | ICD-10-CM

## 2014-03-10 NOTE — ED Notes (Signed)
Pt c/o right knee pain w/o injury x 1 day 

## 2014-03-10 NOTE — ED Notes (Signed)
C/o rt knee pain x 1 day  Denies inj,  No deformity or swelling noted

## 2014-03-10 NOTE — ED Provider Notes (Signed)
CSN: [id number]     Arrival date & time 03/10/14  2154 History   First MD Initiated Contact with Patient 03/10/14 2304     Chief Complaint  Patient presents with  . Knee Pain      Patient is a 23 y.o. male presenting with knee pain. The history is provided by the patient.  Knee Pain Location:  Knee Injury: no   Knee location:  R knee Pain details:    Quality:  Aching   Radiates to:  Does not radiate   Severity:  Moderate   Onset quality:  Sudden   Timing:  Constant   Progression:  Unchanged Chronicity:  New Dislocation: no   Foreign body present:  No foreign bodies Relieved by:  Nothing Worsened by:  Nothing tried Ineffective treatments:  None tried Associated symptoms: no back pain   Risk factors: no concern for non-accidental trauma    HPI Comments: Clinton Kennedy is a 23 y.o. male who presents to the Emergency Department complaining of right knee pain and swelling, onset this morning. Denies causative trauma or injury. [facility] taken [facility] pain  History reviewed. No pertinent past medical history. History reviewed. No pertinent past surgical history. History reviewed. No pertinent family history. History  Substance Use Topics  . Smoking status: Current Every Day Smoker -- 1.00 packs/day    Types: Cigarettes  . Smokeless tobacco: Not on file  . Alcohol Use: No    Review of Systems  Musculoskeletal: Negative for back pain.  All other systems reviewed and are negative.      Allergies  Review of patient's allergies indicates no known allergies.  Home Medications  No current outpatient prescriptions on file. BP 107/63  Pulse 64  Temp(Src) 97.9 F (36.6 C) (Oral)  Resp 18  Ht 5\' 11"  (1.803 m)  Wt 165 lb (74.844 kg)  BMI 23.02 kg/m2  SpO2 100% Physical Exam  Nursing note and vitals reviewed. Constitutional: He is oriented to person, place, and time. He appears well-developed and well-nourished. No distress.  HENT:  Head: Normocephalic and  atraumatic.  Mouth/Throat: Oropharynx is clear and moist.  Eyes: EOM are normal. Pupils are equal, round, and reactive to light.  Neck: Neck supple. No tracheal deviation present.  Cardiovascular: Normal rate.   Pulmonary/Chest: Effort normal. No respiratory distress.  Abdominal: Soft. Bowel sounds are normal. There is no tenderness. There is no rebound.  Musculoskeletal: Normal range of motion. He exhibits no edema and no tenderness.  Right knee: No laxity to varus or valgus stress. No effusion. No tibial plateau tenderness  Negative antior and posterier drawer test of the right knee Intact distal pulses Right foot NVI No patella [place] or [place], intact tendons intact gait   Neurological: He is alert and oriented to person, place, and time.  Skin: Skin is warm and dry.  Psychiatric: He has a normal mood and affect. His behavior is normal.    ED Course  Procedures (including critical care time) Labs Review Labs Reviewed - No data to display Imaging Review No results found.   EKG Interpretation None      MDM   Final diagnoses:  None    Pain medication Ice and elevation  I personally performed the services described in this documentation, which was scribed in my presence. The recorded information has been reviewed and is accurate.     Jasmine AweApril K Hanaa Payes-Rasch, MD 03/11/14 [id number]

## 2014-03-11 ENCOUNTER — Encounter (HOSPITAL_BASED_OUTPATIENT_CLINIC_OR_DEPARTMENT_OTHER): Payer: Self-pay | Admitting: Emergency Medicine

## 2014-03-11 MED ORDER — TRAMADOL HCL 50 MG PO TABS: 50.0000 mg | ORAL_TABLET | Freq: Four times a day (QID) | ORAL | Status: DC | PRN

## 2014-03-11 MED ORDER — IBUPROFEN 800 MG PO TABS: 800.0000 mg | ORAL_TABLET | Freq: Three times a day (TID) | ORAL | Status: DC

## 2014-03-11 NOTE — Discharge Instructions (Signed)
Arthralgia  Your caregiver has diagnosed you as suffering from an arthralgia. Arthralgia means there is pain in a joint. This can come from many reasons including:  · Bruising the joint which causes soreness (inflammation) in the joint.  · Wear and tear on the joints which occur as we grow older (osteoarthritis).  · Overusing the joint.  · Various forms of arthritis.  · Infections of the joint.  Regardless of the cause of pain in your joint, most of these different pains respond to anti-inflammatory drugs and rest. The exception to this is when a joint is infected, and these cases are treated with antibiotics, if it is a bacterial infection.  HOME CARE INSTRUCTIONS   · Rest the injured area for as long as directed by your caregiver. Then slowly start using the joint as directed by your caregiver and as the pain allows. Crutches as directed may be useful if the ankles, knees or hips are involved. If the knee was splinted or casted, continue use and care as directed. If an stretchy or elastic wrapping bandage has been applied today, it should be removed and re-applied every 3 to 4 hours. It should not be applied tightly, but firmly enough to keep swelling down. Watch toes and feet for swelling, bluish discoloration, coldness, numbness or excessive pain. If any of these problems (symptoms) occur, remove the ace bandage and re-apply more loosely. If these symptoms persist, contact your caregiver or return to this location.  · For the first 24 hours, keep the injured extremity elevated on pillows while lying down.  · Apply ice for 15-20 minutes to the sore joint every couple hours while awake for the first half day. Then 03-04 times per day for the first 48 hours. Put the ice in a plastic bag and place a towel between the bag of ice and your skin.  · Wear any splinting, casting, elastic bandage applications, or slings as instructed.  · Only take over-the-counter or prescription medicines for pain, discomfort, or fever as  directed by your caregiver. Do not use aspirin immediately after the injury unless instructed by your physician. Aspirin can cause increased bleeding and bruising of the tissues.  · If you were given crutches, continue to use them as instructed and do not resume weight bearing on the sore joint until instructed.  Persistent pain and inability to use the sore joint as directed for more than 2 to 3 days are warning signs indicating that you should see a caregiver for a follow-up visit as soon as possible. Initially, a hairline fracture (break in bone) may not be evident on X-rays. Persistent pain and swelling indicate that further evaluation, non-weight bearing or use of the joint (use of crutches or slings as instructed), or further X-rays are indicated. X-rays may sometimes not show a small fracture until a week or 10 days later. Make a follow-up appointment with your own caregiver or one to whom we have referred you. A radiologist (specialist in reading X-rays) may read your X-rays. Make sure you know how you are to obtain your X-ray results. Do not assume everything is normal if you do not hear from us.  SEEK MEDICAL CARE IF:  Bruising, swelling, or pain increases.  SEEK IMMEDIATE MEDICAL CARE IF:   · Your fingers or toes are numb or blue.  · The pain is not responding to medications and continues to stay the same or get worse.  · The pain in your joint becomes severe.  · You   develop a fever over 102° F (38.9° C).  · It becomes impossible to move or use the joint.  MAKE SURE YOU:   · Understand these instructions.  · Will watch your condition.  · Will get help right away if you are not doing well or get worse.  Document Released: 11/20/2005 Document Revised: 02/12/2012 Document Reviewed: 07/08/2008  [employer]® Patient Information ©2014 [employer].

## 2014-07-30 ENCOUNTER — Encounter (HOSPITAL_BASED_OUTPATIENT_CLINIC_OR_DEPARTMENT_OTHER): Payer: Self-pay | Admitting: Emergency Medicine

## 2014-07-30 ENCOUNTER — Emergency Department (HOSPITAL_BASED_OUTPATIENT_CLINIC_OR_DEPARTMENT_OTHER)
Admission: EM | Admit: 2014-07-30 | Discharge: 2014-07-30 | Disposition: A | Discharge status: Home / Self Care | Payer: BC Managed Care – PPO | Attending: Emergency Medicine | Admitting: Emergency Medicine

## 2014-07-30 ENCOUNTER — Emergency Department (HOSPITAL_BASED_OUTPATIENT_CLINIC_OR_DEPARTMENT_OTHER): Payer: BC Managed Care – PPO

## 2014-07-30 DIAGNOSIS — G8921 Chronic pain due to trauma: Secondary | ICD-10-CM | POA: Insufficient documentation

## 2014-07-30 DIAGNOSIS — Y929 Unspecified place or not applicable: Secondary | ICD-10-CM | POA: Insufficient documentation

## 2014-07-30 DIAGNOSIS — F17200 Nicotine dependence, unspecified, uncomplicated: Secondary | ICD-10-CM | POA: Diagnosis not present

## 2014-07-30 DIAGNOSIS — Y939 Activity, unspecified: Secondary | ICD-10-CM | POA: Insufficient documentation

## 2014-07-30 DIAGNOSIS — IMO0002 Reserved for concepts with insufficient information to code with codable children: Secondary | ICD-10-CM | POA: Insufficient documentation

## 2014-07-30 DIAGNOSIS — Z791 Long term (current) use of non-steroidal anti-inflammatories (NSAID): Secondary | ICD-10-CM | POA: Diagnosis not present

## 2014-07-30 DIAGNOSIS — M25561 Pain in right knee: Secondary | ICD-10-CM

## 2014-07-30 DIAGNOSIS — M25569 Pain in unspecified knee: Secondary | ICD-10-CM | POA: Diagnosis not present

## 2014-07-30 DIAGNOSIS — X58XXXA Exposure to other specified factors, initial encounter: Secondary | ICD-10-CM | POA: Diagnosis not present

## 2014-07-30 NOTE — ED Notes (Signed)
Woke this am with pain in his right knee. No recent injury.

## 2014-07-30 NOTE — ED Provider Notes (Signed)
CSN: [id number]     Arrival date & time 07/30/14  2132 History  This chart was scribed for Purvis Sheffield, MD by [place] Peace, ED Scribe. The patient was seen in MH03/[facility]03. The patient's care was started at 10:25 PM.      Chief Complaint  Patient presents with  . Knee Pain      Patient is a 23 y.o. male presenting with knee pain.  Knee Pain Location:  Knee Time since incident:  1 day Injury: no   Knee location:  R knee Pain details:    Radiates to:  Does not radiate   Severity:  Moderate Chronicity:  Chronic Tetanus status: 2009. Ineffective treatments: Tylenol. Associated symptoms: no fatigue, no fever and no neck pain   Risk factors: no frequent fractures and no recent illness    HPI Comments: Clinton Kennedy is a 23 y.o. male who presents to the Emergency Department complaining of constant right knee pain onset earlier this morning. Pt reports that he took Tylenol for pain pta without much relief. He states that ambulation doesn't bother his knee that much but states that it is really exacerbated by bending and pivoting. He states that the pain has subsided some since arriving here at ED and decides against receiving any pain medication right now. He states that he had injury of right knee over a year ago that still causes some stiffness and discomfort today. Pt works at [employer] and does a lot of bending and lifting of objects on a daily basis. Pt reports that his last tetanus shot was in 2009. Pt is current everyday smoker (1 pack daily).   Pt also has minor abrasion to plantar aspect of left hallux.    History reviewed. No pertinent past medical history. History reviewed. No pertinent past surgical history. No family history on file. History  Substance Use Topics  . Smoking status: Current Every Day Smoker -- 1.00 packs/day    Types: Cigarettes  . Smokeless tobacco: Not on file  . Alcohol Use: No    Review of Systems  Constitutional: Negative for fever and fatigue.   HENT: Negative for congestion and drooling.   Eyes: Negative for pain.  Respiratory: Negative for cough and shortness of breath.   Cardiovascular: Negative for chest pain.  Gastrointestinal: Negative for nausea, vomiting, abdominal pain and diarrhea.  Genitourinary: Negative for dysuria and hematuria.  Musculoskeletal: Negative for neck pain.       Right knee pain.  Skin: Positive for wound. Negative for color change.  Neurological: Negative for dizziness and headaches.  Hematological: Negative for adenopathy.  Psychiatric/Behavioral: Negative for behavioral problems.  All other systems reviewed and are negative.     Allergies  Review of patient's allergies indicates no known allergies.  Home Medications   Prior to Admission medications   Medication Sig Start Date End Date Taking? Authorizing Provider  ibuprofen (ADVIL,MOTRIN) 800 MG tablet Take 1 tablet (800 mg total) by mouth 3 (three) times daily. 03/11/14   April K Palumbo-Rasch, MD  traMADol (ULTRAM) 50 MG tablet Take 1 tablet (50 mg total) by mouth every 6 (six) hours as needed. 03/11/14   April K Palumbo-Rasch, MD   BP 108/65  Pulse 77  Temp(Src) 97.9 F (36.6 C) (Oral)  Resp 20  Ht  (1.803 m)  Wt 165 lb (74.844 kg)  BMI 23.02 kg/m2  SpO2 99% Physical Exam  Nursing note and vitals reviewed. Constitutional: He is oriented to person, place, and time. He appears well-developed and  well-nourished. No distress.  HENT:  Head: Normocephalic and atraumatic.  Eyes: Conjunctivae and EOM are normal.  Neck: Neck supple. No tracheal deviation present.  Cardiovascular: Normal rate.   Pulmonary/Chest: Effort normal. No respiratory distress.  Musculoskeletal: Normal range of motion.  Normal ROM of right knee. Mild tenderness to palpation of quadriceps tendon. Otherwise normal appearance of right knee.  Mild abrasion on the plantar aspect of the left first toe.   Neurological: He is alert and oriented to person, place, and  time.  Skin: Skin is warm and dry.  Psychiatric: He has a normal mood and affect. His behavior is normal.    ED Course  Procedures (including critical care time) Labs Review Labs Reviewed - No data to display  Results for orders placed during the hospital encounter of 11/05/13  URINALYSIS, ROUTINE W REFLEX MICROSCOPIC      Result Value Ref Range   Color, Urine YELLOW  YELLOW   APPearance CLEAR  CLEAR   Specific Gravity, Urine 1.021  1.005 - 1.030   pH 6.5  5.0 - 8.0   Glucose, UA NEGATIVE  NEGATIVE mg/dL   Hgb urine dipstick NEGATIVE  NEGATIVE   Bilirubin Urine NEGATIVE  NEGATIVE   Ketones, ur 15 (*) NEGATIVE mg/dL   Protein, ur NEGATIVE  NEGATIVE mg/dL   Urobilinogen, UA 1.0  0.0 - 1.0 mg/dL   Nitrite NEGATIVE  NEGATIVE   Leukocytes, UA NEGATIVE  NEGATIVE    Imaging Review Dg Knee Complete 4 Views Right  07/30/2014   CLINICAL DATA:  Right knee pain.  No recent injury.  EXAM: RIGHT KNEE - COMPLETE 4+ VIEW  COMPARISON:  03/10/2014.  FINDINGS: There is no evidence of fracture, dislocation, or joint effusion. There is no evidence of arthropathy or other focal bone abnormality. Soft tissues are unremarkable.  IMPRESSION: Normal examination.   Electronically Signed   By: Gordan Payment M.D.   On: 07/30/2014 22:13     EKG Interpretation None     Medications - No data to display  10:30 PM- Treatment plan was discussed with patient who verbalizes understanding and agrees.   MDM   Final diagnoses:  Right knee pain   10:52 PM 23 y.o. male w hx of chronic intermittent right knee pain stemming from a previous injury pw right knee pain. Denies fevers. Is ambulatory. AFVSS here. Normal appearing knee. Pain at the quadriceps tendon. 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 Will rec RICE. Plain films reviewed/interpreted and are non-contrib.   10:53 PM:  I have discussed the diagnosis/risks/treatment options with the patient and believe the pt to be eligible for discharge home to  follow-up with sports medicine as needed. We also discussed returning to the ED immediately if new or worsening sx occur. We discussed the sx which are most concerning (e.g., worsening pain, fever) that necessitate immediate return. Medications administered to the patient during their visit and any new prescriptions provided to the patient are listed below.  Medications given during this visit Medications - No data to display  New Prescriptions   No medications on file     I personally performed the services described in this documentation, which was scribed in my presence. The recorded information has been reviewed and is accurate.  Purvis Sheffield, MD 07/30/14 2256

## 2014-08-18 ENCOUNTER — Emergency Department (HOSPITAL_BASED_OUTPATIENT_CLINIC_OR_DEPARTMENT_OTHER): Payer: BC Managed Care – PPO

## 2014-08-18 ENCOUNTER — Encounter (HOSPITAL_BASED_OUTPATIENT_CLINIC_OR_DEPARTMENT_OTHER): Payer: Self-pay | Admitting: Emergency Medicine

## 2014-08-18 ENCOUNTER — Emergency Department (HOSPITAL_BASED_OUTPATIENT_CLINIC_OR_DEPARTMENT_OTHER)
Admission: EM | Admit: 2014-08-18 | Discharge: 2014-08-18 | Disposition: A | Discharge status: Home / Self Care | Payer: BC Managed Care – PPO | Attending: Emergency Medicine | Admitting: Emergency Medicine

## 2014-08-18 DIAGNOSIS — R05 Cough: Secondary | ICD-10-CM | POA: Diagnosis not present

## 2014-08-18 DIAGNOSIS — F17200 Nicotine dependence, unspecified, uncomplicated: Secondary | ICD-10-CM | POA: Insufficient documentation

## 2014-08-18 DIAGNOSIS — R079 Chest pain, unspecified: Secondary | ICD-10-CM | POA: Insufficient documentation

## 2014-08-18 DIAGNOSIS — Z791 Long term (current) use of non-steroidal anti-inflammatories (NSAID): Secondary | ICD-10-CM | POA: Diagnosis not present

## 2014-08-18 DIAGNOSIS — R059 Cough, unspecified: Secondary | ICD-10-CM | POA: Diagnosis not present

## 2014-08-18 MED ORDER — ALBUTEROL SULFATE HFA 108 (90 BASE) MCG/ACT IN AERS
2.0000 | INHALATION_SPRAY | RESPIRATORY_TRACT | Status: DC | PRN
  Administered 2014-08-18: 2 via RESPIRATORY_TRACT
  Filled 2014-08-18: qty 6.7

## 2014-08-18 MED ORDER — IBUPROFEN 800 MG PO TABS
800.0000 mg | ORAL_TABLET | Freq: Once | ORAL | Status: AC
  Administered 2014-08-18: 800 mg via ORAL
  Filled 2014-08-18: qty 1

## 2014-08-18 NOTE — Discharge Instructions (Signed)
Return to the ED with any concerns including difficulty breathing, worsening pain, fainting, leg swelling, decreased level of alertness/lethargy, or any other alarming symptoms  You should use the albuterol inhaler 2 puffs every 4 hours as needed for cough

## 2014-08-18 NOTE — ED Notes (Signed)
Chest pain. He woke with the pain. Same off and on for a year.

## 2014-08-18 NOTE — ED Provider Notes (Signed)
CSN: [id number]     Arrival date & time 08/18/14  1931 History  This chart was scribed for Ethelda Chick, MD by Roxy Cedar, ED Scribe. This patient was seen in room MH09/MH09 and the patient's care was started at 10:21 PM.   Chief Complaint  Patient presents with  . Chest Pain   Patient is a 23 y.o. male presenting with chest pain. The history is provided by the patient. No language interpreter was used.  Chest Pain Pain location:  L chest Pain quality: sharp and stabbing   Pain radiates to:  Does not radiate Pain radiates to the back: no   Pain severity:  Moderate Onset quality:  Gradual Timing:  Intermittent Associated symptoms: cough   Associated symptoms: no shortness of breath     HPI Comments: Clinton Kennedy is a 23 y.o. male who presents to the Emergency Department complaining of moderate intermittent episodes of sharp, stabbing left sided chest pain. Patient states that he has felt his chest pain "for a while". He states that he "was sick 1-2 weeks ago, and had a persistent cough that never went away". He states that he has experienced chest pain before the cough began 2 weeks ago. Patient denies associated shortness of breath. No medications were taken prior to arrival. Patient is a current every day smoker 1ppd. Patient denies any prior history of heart disease or diabetes.  History reviewed. No pertinent past medical history. History reviewed. No pertinent past surgical history. No family history on file. History  Substance Use Topics  . Smoking status: Current Every Day Smoker -- 1.00 packs/day    Types: Cigarettes  . Smokeless tobacco: Not on file  . Alcohol Use: No    Review of Systems  Respiratory: Positive for cough. Negative for shortness of breath.   Cardiovascular: Positive for chest pain.  All other systems reviewed and are negative.  Allergies  Review of patient's allergies indicates no known allergies.  Home Medications   Prior to Admission  medications   Medication Sig Start Date End Date Taking? Authorizing Provider  ibuprofen (ADVIL,MOTRIN) 800 MG tablet Take 1 tablet (800 mg total) by mouth 3 (three) times daily. 03/11/14   April K Palumbo-Rasch, MD  traMADol (ULTRAM) 50 MG tablet Take 1 tablet (50 mg total) by mouth every 6 (six) hours as needed. 03/11/14   April Smitty Cords, MD   Triage Vitals: BP 117/71  Pulse 77  Temp(Src) 98.2 F (36.8 C) (Oral)  Resp 20  Ht  (1.803 m)  Wt 165 lb (74.844 kg)  BMI 23.02 kg/m2  SpO2 97%  Physical Exam  Nursing note and vitals reviewed. Constitutional: He is oriented to person, place, and time. He appears well-developed and well-nourished. No distress.  HENT:  Head: Normocephalic and atraumatic.  Eyes: Conjunctivae and EOM are normal.  Neck: Neck supple. No tracheal deviation present.  Cardiovascular: Normal rate, regular rhythm and normal heart sounds.   Pulmonary/Chest: Effort normal and breath sounds normal. No respiratory distress. He has no wheezes.  Musculoskeletal: Normal range of motion. He exhibits no edema.  No peripheral edema.  Neurological: He is alert and oriented to person, place, and time.  Skin: Skin is warm and dry.  Psychiatric: He has a normal mood and affect. His behavior is normal.   ED Course  Procedures (including critical care time)  DIAGNOSTIC STUDIES: Oxygen Saturation is 97% on RA, normal by my interpretation.    COORDINATION OF CARE: 10:37 PM- Discussed plans to order  diagnostic CXR and EKG. Will give patient ibuprofen. Pt advised of plan for treatment and pt agrees.  Labs Review Labs Reviewed - No data to display  Imaging Review Dg Chest 2 View  08/18/2014   CLINICAL DATA:  Left chest pain today. Cough, shortness of breath, congestion. Smoker.  EXAM: CHEST  2 VIEW  COMPARISON:  None.  FINDINGS: The heart size and mediastinal contours are within normal limits. Both lungs are clear. The visualized skeletal structures are unremarkable.   IMPRESSION: No active cardiopulmonary disease.   Electronically Signed   By: Rosalie Gums M.D.   On: 08/18/2014 23:10     EKG Interpretation   Date/Time:  Tuesday August 18 2014 19:43:07 EDT Ventricular Rate:  71 PR Interval:  132 QRS Duration: 92 QT Interval:  376 QTC Calculation: 408 R Axis:   79 Text Interpretation:  Normal sinus rhythm Nonspecific ST abnormality  Abnormal ECG No old tracing to compare Confirmed by [facility]  MD, MARTHA  [phone]) on 08/18/2014 9:48:54 PM     MDM   Final diagnoses:  Chest pain, unspecified chest pain type    Pt presenting with c/o chest pain that has been intermittent over the past year .  He feels it is worse in the setting of a recent cough that has been lingering.  EKG and CXR are reassuring- no acute abnormalities to suggest ischemia- pain is sharp and not associated with exertion.  Pt counseled to stop smoking.  Heart score 0, PERC 0.  Pt given ibuprofen and albuterol MDI for likely bronchitis compoenint.  Doubt ACS, PE, no pneumonia or PTX on cXR.  Discharged with strict return precautions.  Pt agreeable with plan.  Nursing notes including past medical history and social history reviewed and considered in documentation   I personally performed the services described in this documentation, which was scribed in my presence. The recorded information has been reviewed and is accurate.  Ethelda Chick, MD 08/18/14 [phone]

## 2014-08-18 NOTE — Patient Instructions (Signed)
Instructed patient on the proper use of administering albuterol mdi via aerochamber patient tolerated well 

## 2015-03-29 IMAGING — CR DG CHEST 2V
2 series · 2 of 2 positions shown · non-contrast
Comparison: None.

CLINICAL DATA: Left chest pain today. Cough, shortness of breath,
congestion. Smoker.

EXAM:
CHEST  2 VIEW

[w chest pa]
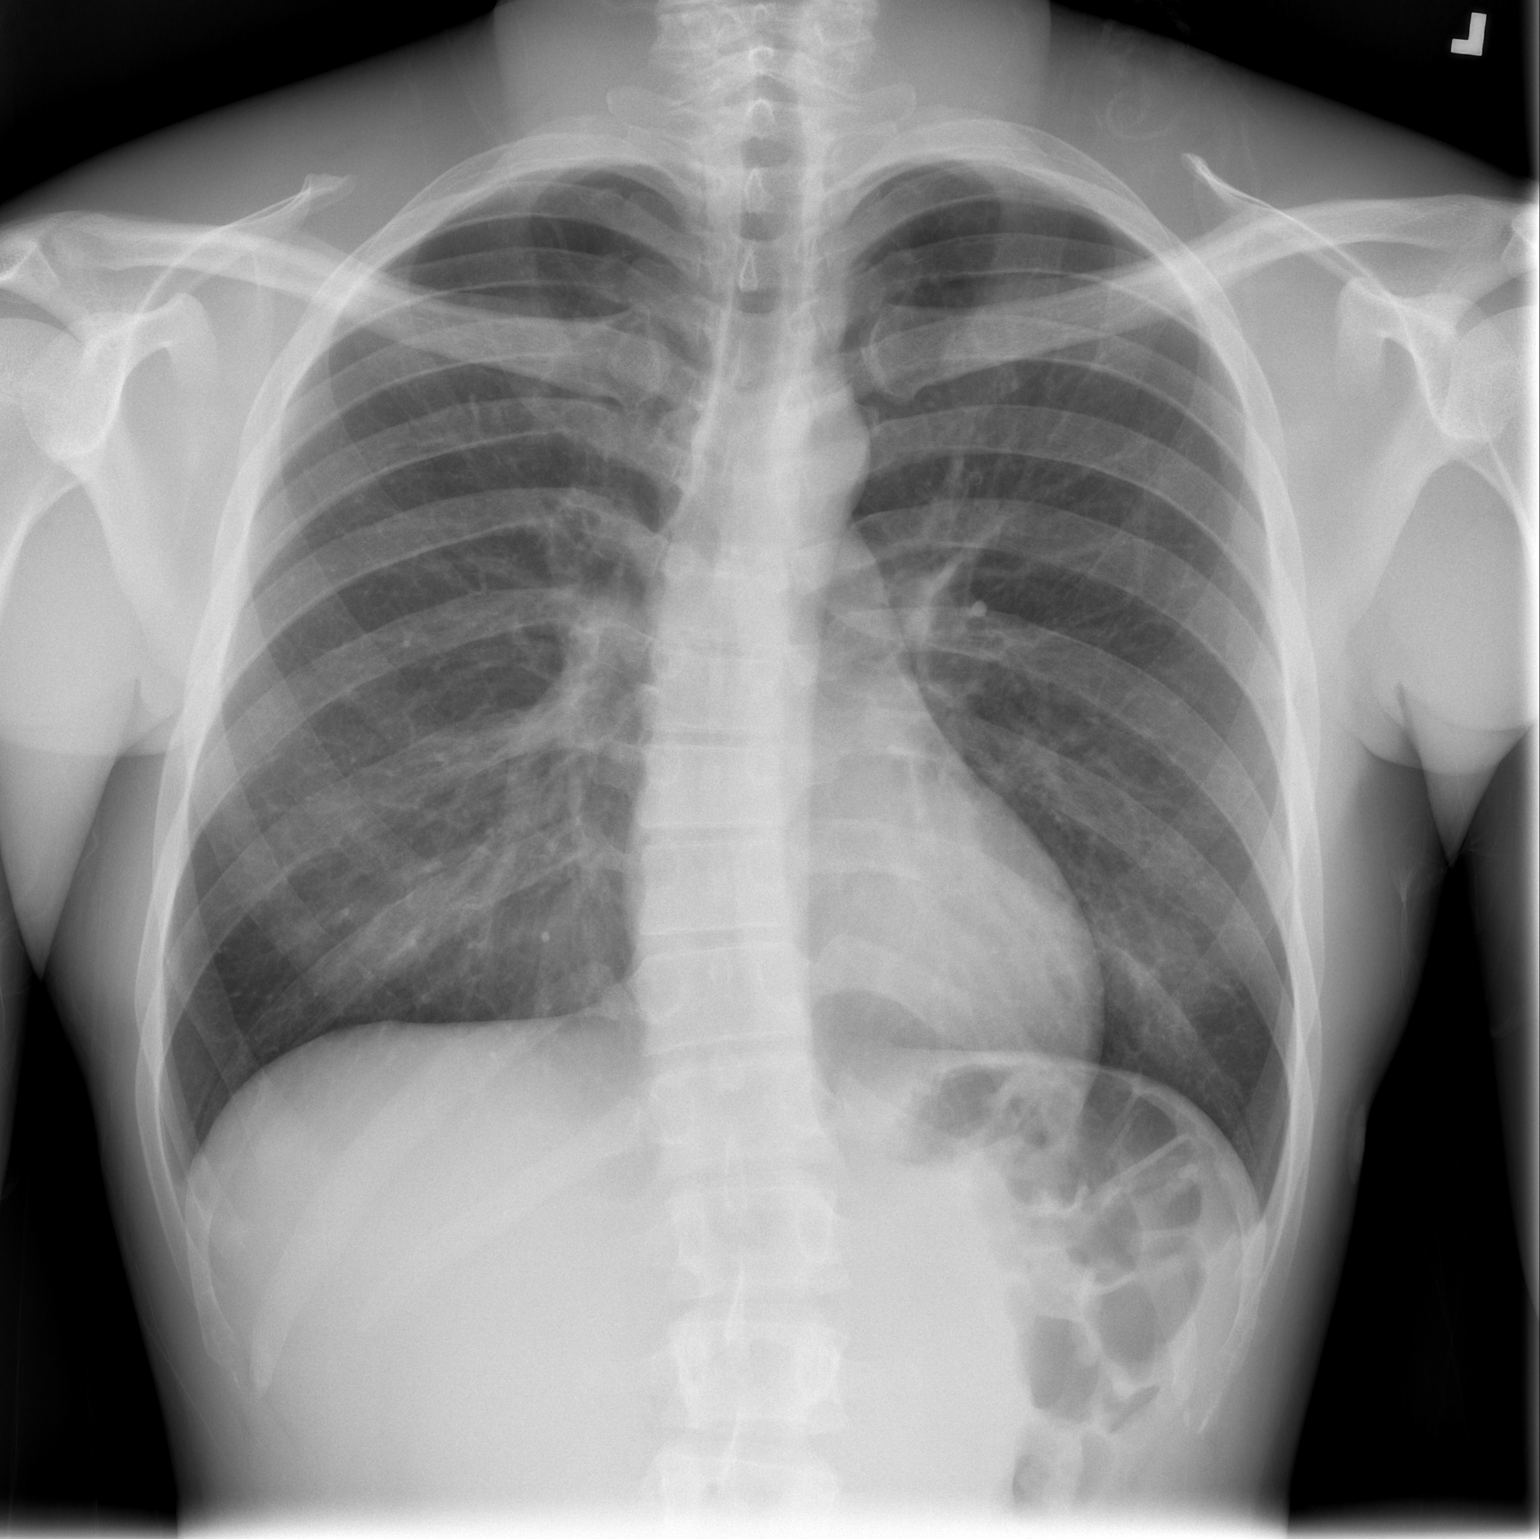

[w chest lat]
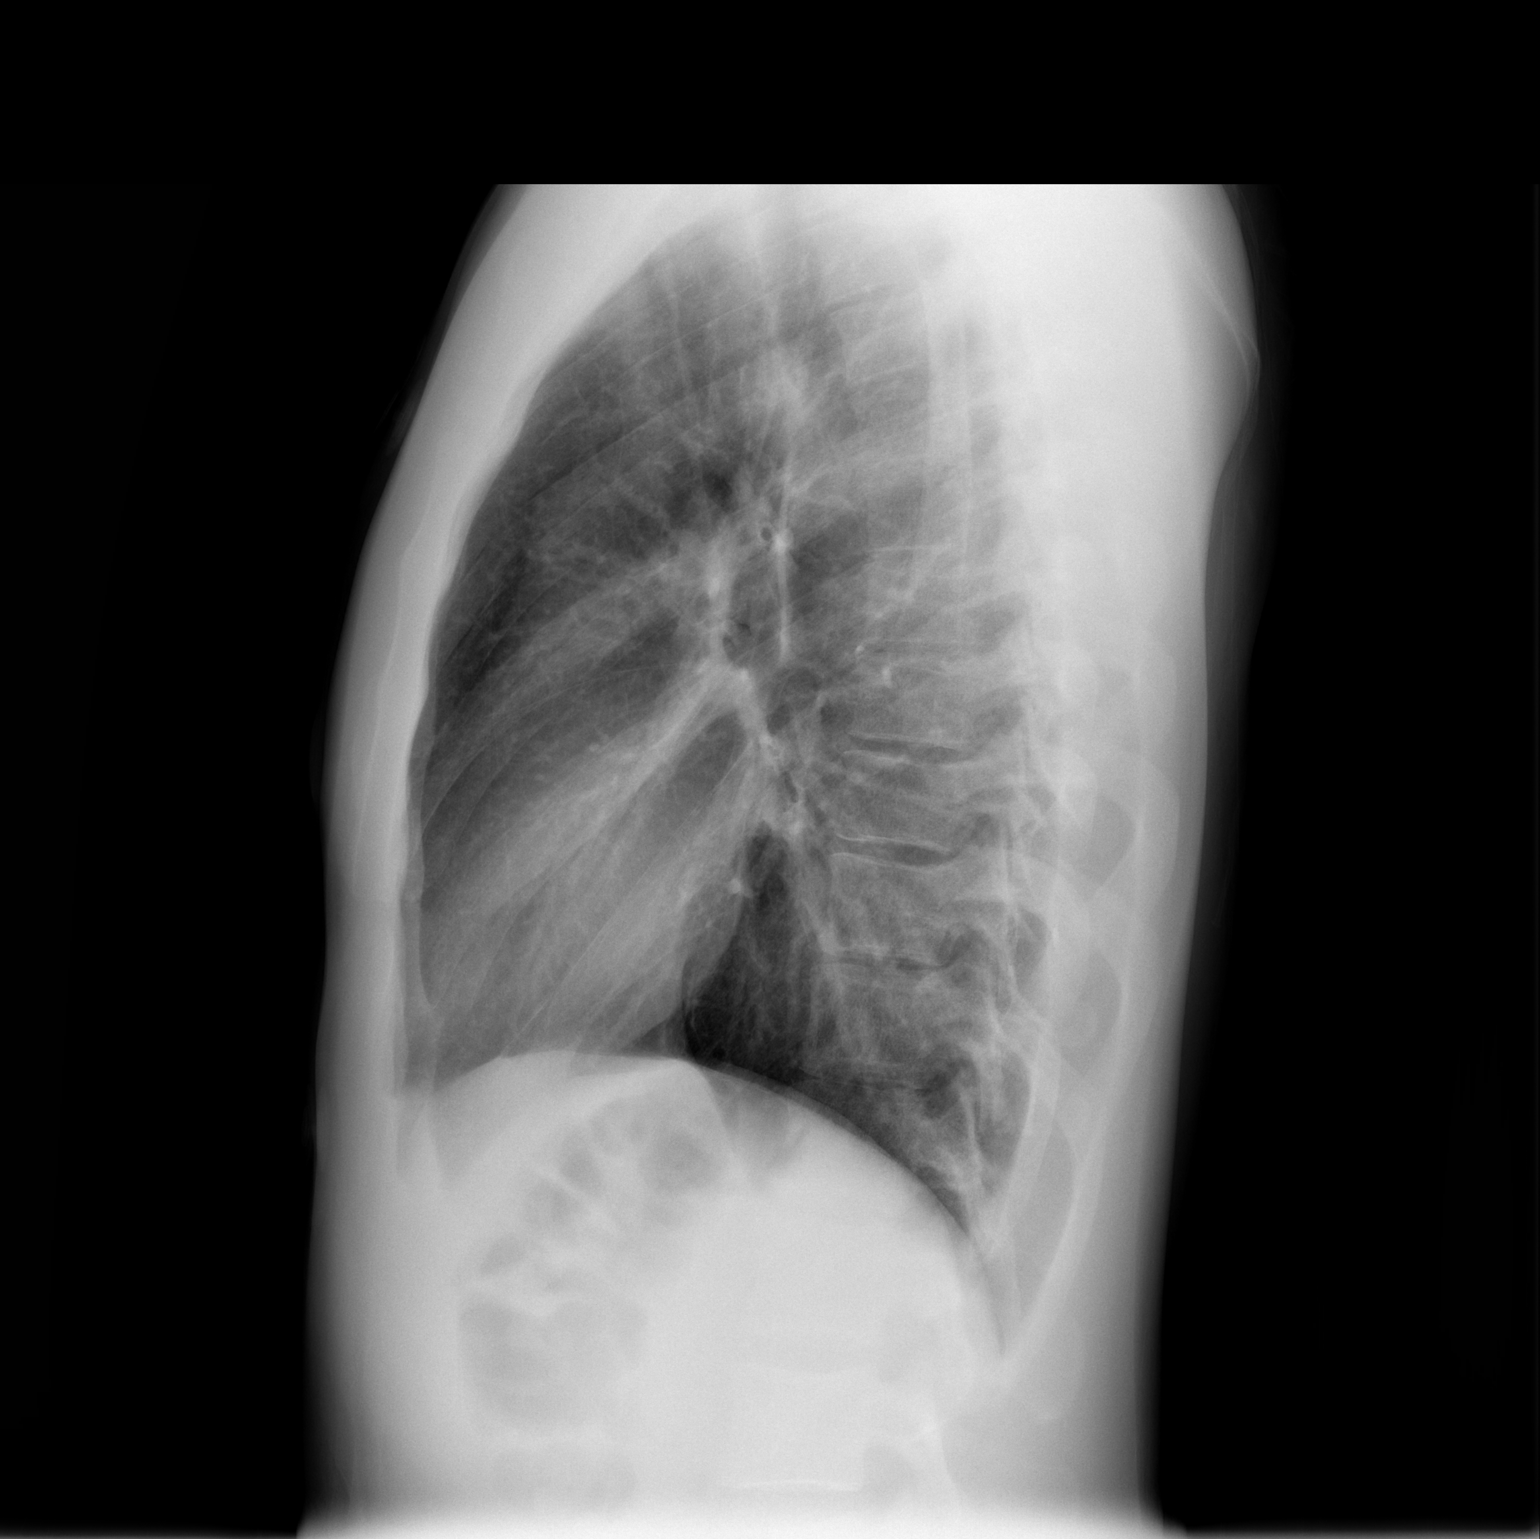

[2 of 2 positions shown; findings below may reference images not displayed]

FINDINGS: The heart size and mediastinal contours are within normal limits.
Both lungs are clear. The visualized skeletal structures are
unremarkable.
IMPRESSION: No active cardiopulmonary disease.

## 2017-10-18 ENCOUNTER — Encounter (HOSPITAL_BASED_OUTPATIENT_CLINIC_OR_DEPARTMENT_OTHER): Payer: Self-pay

## 2017-10-18 ENCOUNTER — Emergency Department (HOSPITAL_BASED_OUTPATIENT_CLINIC_OR_DEPARTMENT_OTHER)
Admission: EM | Admit: 2017-10-18 | Discharge: 2017-10-18 | Disposition: A | Discharge status: Home / Self Care | Payer: BLUE CROSS/BLUE SHIELD | Attending: Emergency Medicine | Admitting: Emergency Medicine

## 2017-10-18 ENCOUNTER — Other Ambulatory Visit: Payer: Self-pay

## 2017-10-18 DIAGNOSIS — R0981 Nasal congestion: Secondary | ICD-10-CM | POA: Diagnosis not present

## 2017-10-18 DIAGNOSIS — M791 Myalgia, unspecified site: Secondary | ICD-10-CM | POA: Insufficient documentation

## 2017-10-18 DIAGNOSIS — R51 Headache: Secondary | ICD-10-CM | POA: Insufficient documentation

## 2017-10-18 DIAGNOSIS — R509 Fever, unspecified: Secondary | ICD-10-CM | POA: Insufficient documentation

## 2017-10-18 DIAGNOSIS — Z5321 Procedure and treatment not carried out due to patient leaving prior to being seen by health care provider: Secondary | ICD-10-CM | POA: Diagnosis not present

## 2017-10-18 MED ORDER — IBUPROFEN 400 MG PO TABS
600.0000 mg | ORAL_TABLET | Freq: Once | ORAL | Status: AC
  Administered 2017-10-18: 600 mg via ORAL
  Filled 2017-10-18: qty 1

## 2017-10-18 NOTE — ED Triage Notes (Signed)
Pt c/o generalized body aches, congestion, headache, fatigue, subjective fever, has not taken any OTC meds for any symptoms, pt wants to "make sure I don't have the early stages of the flu."

## 2018-08-14 ENCOUNTER — Emergency Department (HOSPITAL_BASED_OUTPATIENT_CLINIC_OR_DEPARTMENT_OTHER): Payer: BLUE CROSS/BLUE SHIELD

## 2018-08-14 ENCOUNTER — Other Ambulatory Visit: Payer: Self-pay

## 2018-08-14 ENCOUNTER — Emergency Department (HOSPITAL_BASED_OUTPATIENT_CLINIC_OR_DEPARTMENT_OTHER)
Admission: EM | Admit: 2018-08-14 | Discharge: 2018-08-14 | Disposition: A | Discharge status: Home / Self Care | Payer: BLUE CROSS/BLUE SHIELD | Attending: Emergency Medicine | Admitting: Emergency Medicine

## 2018-08-14 ENCOUNTER — Encounter (HOSPITAL_BASED_OUTPATIENT_CLINIC_OR_DEPARTMENT_OTHER): Payer: Self-pay

## 2018-08-14 DIAGNOSIS — J069 Acute upper respiratory infection, unspecified: Secondary | ICD-10-CM | POA: Diagnosis not present

## 2018-08-14 DIAGNOSIS — B9789 Other viral agents as the cause of diseases classified elsewhere: Secondary | ICD-10-CM | POA: Insufficient documentation

## 2018-08-14 DIAGNOSIS — F17290 Nicotine dependence, other tobacco product, uncomplicated: Secondary | ICD-10-CM | POA: Diagnosis not present

## 2018-08-14 DIAGNOSIS — Z79899 Other long term (current) drug therapy: Secondary | ICD-10-CM | POA: Diagnosis not present

## 2018-08-14 DIAGNOSIS — R05 Cough: Secondary | ICD-10-CM | POA: Diagnosis present

## 2018-08-14 MED ORDER — OSELTAMIVIR PHOSPHATE 75 MG PO CAPS: 75.0000 mg | ORAL_CAPSULE | Freq: Two times a day (BID) | ORAL | 0 refills | Status: DC

## 2018-08-14 NOTE — ED Notes (Signed)
Patient transported to X-ray 

## 2018-08-14 NOTE — Discharge Instructions (Signed)
You are evaluated in the emergency department for 2 days of nasal congestion cough fevers chills headache and body aches.  You had a chest x-ray that did not show an obvious pneumonia.  We also did a flu swab and the results of that should be back within the next day or so.  This is likely a viral infection and you will need to take Tylenol and ibuprofen and drink plenty of fluids.  We are prescribing you Tamiflu to take if your influenza test is positive.  Please follow-up with your doctor and return if any worsening symptoms.

## 2018-08-14 NOTE — ED Provider Notes (Signed)
[facility] EMERGENCY DEPARTMENT Provider Note   CSN: [id number] Arrival date & time: 08/14/18  2151     History   Chief Complaint Chief Complaint  Patient presents with  . Cough    HPI Clinton Kennedy is a 27 y.o. male.  He is complaining of nasal congestion headache fevers chills and nonproductive cough since yesterday.  York Spaniel his sister had some similar symptoms.  He checked his temperature and the highest was just below 100.  He is tried nothing for it.  No recent travel.  No nausea no vomiting no diarrhea.  No rashes.  The history is provided by the patient.  Cough  This is a new problem. The current episode started yesterday. The problem occurs every few minutes. The problem has not changed since onset.The cough is non-productive. The maximum temperature recorded prior to his arrival was 100 to 100.9 F. The fever has been present for 1 to 2 days. Associated symptoms include chills, sweats, headaches, rhinorrhea and myalgias. Pertinent negatives include no chest pain, no ear congestion, no ear pain, no sore throat, no shortness of breath, no wheezing and no eye redness. He has tried nothing for the symptoms. The treatment provided no relief. He is a smoker.    History reviewed. No pertinent past medical history.  There are no active problems to display for this patient.   History reviewed. No pertinent surgical history.      Home Medications    Prior to Admission medications   Medication Sig Start Date End Date Taking? Authorizing Provider  ibuprofen (ADVIL,MOTRIN) 800 MG tablet Take 1 tablet (800 mg total) by mouth 3 (three) times daily. 03/11/14   Palumbo, April, MD  traMADol (ULTRAM) 50 MG tablet Take 1 tablet (50 mg total) by mouth every 6 (six) hours as needed. 03/11/14   Palumbo, April, MD    Family History No family history on file.  Social History Social History   Tobacco Use  . Smoking status: Current Every Day Smoker    Packs/day: 1.00    Types:  Cigars  . Smokeless tobacco: Never Used  Substance Use Topics  . Alcohol use: No  . Drug use: Yes    Types: Marijuana     Allergies   Patient has no known allergies.   Review of Systems Review of Systems  Constitutional: Positive for appetite change and chills.  HENT: Positive for postnasal drip and rhinorrhea. Negative for ear pain and sore throat.   Eyes: Negative for redness.  Respiratory: Positive for cough. Negative for shortness of breath and wheezing.   Cardiovascular: Negative for chest pain.  Gastrointestinal: Negative for diarrhea, nausea and vomiting.  Genitourinary: Negative for dysuria.  Musculoskeletal: Positive for myalgias.  Skin: Negative for rash.  Neurological: Positive for headaches.     Physical Exam Updated Vital Signs BP 138/90 (BP Location: Left Arm)   Pulse 98   Temp 98.8 F (37.1 C) (Oral)   Resp 18   Ht 5\' 11"  (1.803 m)   Wt 79.7 kg   SpO2 98%   BMI 24.51 kg/m   Physical Exam  Constitutional: He appears well-developed and well-nourished.  HENT:  Head: Normocephalic and atraumatic.  Right Ear: Tympanic membrane and external ear normal.  Left Ear: Tympanic membrane and external ear normal.  Nose: Nose normal.  Mouth/Throat: Uvula is midline and mucous membranes are normal. No trismus in the jaw. Posterior oropharyngeal erythema present. No oropharyngeal exudate.  Eyes: Conjunctivae are normal.  Neck: Neck supple.  Cardiovascular: Normal rate and regular rhythm.  No murmur heard. Pulmonary/Chest: Effort normal and breath sounds normal. No respiratory distress.  Abdominal: Soft. There is no tenderness.  Musculoskeletal: He exhibits no edema.  Neurological: He is alert.  Skin: Skin is warm and dry.  Psychiatric: He has a normal mood and affect.  Nursing note and vitals reviewed.    ED Treatments / Results  Labs (all labs ordered are listed, but only abnormal results are displayed) Labs Reviewed  INFLUENZA PANEL BY PCR (TYPE A &  B)    EKG None  Radiology Dg Chest 2 View  Result Date: 08/14/2018 CLINICAL DATA:  Cough and fever EXAM: CHEST - 2 VIEW COMPARISON:  08/18/2014 FINDINGS: Mild interstitial opacity in the lower lungs. No focal consolidation or effusion. Normal heart size. No pneumothorax. IMPRESSION: Increased interstitial opacity in the lower lungs may relate to bronchial inflammation. No focal pneumonia is seen. Electronically Signed   By: Jasmine Pang M.D.   On: 08/14/2018 23:07    Procedures Procedures (including critical care time)  Medications Ordered in ED Medications - No data to display   Initial Impression / Assessment and Plan / ED Course  I have reviewed the triage vital signs and the nursing notes.  Pertinent labs & imaging results that were available during my care of the patient were reviewed by me and considered in my medical decision making (see chart for details).  Clinical Course as of Aug 14 2325  Wed Aug 14, 2018  22336 Healthy 27 year old male here with 2 days of likely viral syndrome with myalgias headache cough nasal congestion runny nose postnasal drip.  His vital signs are unremarkable and no significant findings on exam.  I have ordered a chest x-ray and an influenza swab.   [MB]    Clinical Course User Index [MB] Terrilee Files, MD      Final Clinical Impressions(s) / ED Diagnoses   Final diagnoses:  Viral URI with cough    ED Discharge Orders         Ordered    oseltamivir (TAMIFLU) 75 MG capsule  Every 12 hours     08/14/18 2257           Terrilee Files, MD 08/14/18 2327

## 2018-08-14 NOTE — ED Triage Notes (Signed)
C/o flu like sx day 3-NAD-steady gait 

## 2018-08-15 LAB — INFLUENZA PANEL BY PCR (TYPE A & B)
Influenza A By PCR: NEGATIVE
Influenza B By PCR: NEGATIVE

## 2020-06-01 ENCOUNTER — Encounter (HOSPITAL_BASED_OUTPATIENT_CLINIC_OR_DEPARTMENT_OTHER): Payer: Self-pay | Admitting: Emergency Medicine

## 2020-06-01 ENCOUNTER — Emergency Department (HOSPITAL_BASED_OUTPATIENT_CLINIC_OR_DEPARTMENT_OTHER)
Admission: EM | Admit: 2020-06-01 | Discharge: 2020-06-01 | Disposition: A | Discharge status: Home / Self Care | Payer: BC Managed Care – PPO | Attending: Emergency Medicine | Admitting: Emergency Medicine

## 2020-06-01 ENCOUNTER — Other Ambulatory Visit: Payer: Self-pay

## 2020-06-01 DIAGNOSIS — Y999 Unspecified external cause status: Secondary | ICD-10-CM | POA: Diagnosis not present

## 2020-06-01 DIAGNOSIS — Y939 Activity, unspecified: Secondary | ICD-10-CM | POA: Insufficient documentation

## 2020-06-01 DIAGNOSIS — X58XXXA Exposure to other specified factors, initial encounter: Secondary | ICD-10-CM | POA: Diagnosis not present

## 2020-06-01 DIAGNOSIS — S76811A Strain of other specified muscles, fascia and tendons at thigh level, right thigh, initial encounter: Secondary | ICD-10-CM | POA: Insufficient documentation

## 2020-06-01 DIAGNOSIS — F17210 Nicotine dependence, cigarettes, uncomplicated: Secondary | ICD-10-CM | POA: Diagnosis not present

## 2020-06-01 DIAGNOSIS — Y929 Unspecified place or not applicable: Secondary | ICD-10-CM | POA: Diagnosis not present

## 2020-06-01 DIAGNOSIS — M79604 Pain in right leg: Secondary | ICD-10-CM | POA: Diagnosis present

## 2020-06-01 DIAGNOSIS — S76211A Strain of adductor muscle, fascia and tendon of right thigh, initial encounter: Secondary | ICD-10-CM

## 2020-06-01 MED ORDER — KETOROLAC TROMETHAMINE 60 MG/2ML IM SOLN
15.0000 mg | Freq: Once | INTRAMUSCULAR | Status: AC
  Administered 2020-06-01: 15 mg via INTRAMUSCULAR
  Filled 2020-06-01: qty 2

## 2020-06-01 MED ORDER — ACETAMINOPHEN 500 MG PO TABS
1000.0000 mg | ORAL_TABLET | Freq: Once | ORAL | Status: AC
  Administered 2020-06-01: 1000 mg via ORAL
  Filled 2020-06-01: qty 2

## 2020-06-01 NOTE — ED Triage Notes (Addendum)
Pt is c/o pain in his right leg in his thigh/groin area  Pain since last Thursday or Friday  Pt denies injury

## 2020-06-01 NOTE — ED Provider Notes (Signed)
[facility] EMERGENCY DEPARTMENT Provider Note   CSN: [id number] Arrival date & time: 06/01/20  2045     History Chief Complaint  Patient presents with  . Leg Pain    Clinton Kennedy is a 29 y.o. male.  29 yo M with a chief complaints of right groin pain.  Going on for about 4 or 5 days now.  No overt injury.  Worse with ambulation and palpation.  Feels like it is on the inside of his thigh.  Denies pain in the penis or testicles.  The history is provided by the patient and the spouse.  Leg Pain Location:  Leg Time since incident:  5 days Injury: no   Leg location:  R upper leg Pain details:    Quality:  Aching   Radiates to:  Groin   Severity:  Moderate   Onset quality:  Gradual   Duration:  5 days   Timing:  Constant   Progression:  Worsening Chronicity:  New Prior injury to area:  No Relieved by:  Nothing Worsened by:  Bearing weight Ineffective treatments:  None tried Associated symptoms: no fever        History reviewed. No pertinent past medical history.  There are no problems to display for this patient.   Past Surgical History:  Procedure Laterality Date  . MOUTH SURGERY         Family History  Problem Relation Age of Onset  . Diabetes Other   . Stroke Other     Social History   Tobacco Use  . Smoking status: Current Every Day Smoker    Packs/day: 1.00    Types: Cigars  . Smokeless tobacco: Never Used  Vaping Use  . Vaping Use: Never used  Substance Use Topics  . Alcohol use: Yes    Comment: social  . Drug use: Yes    Types: Marijuana    Home Medications Prior to Admission medications   Medication Sig Start Date End Date Taking? Authorizing Provider  ibuprofen (ADVIL,MOTRIN) 800 MG tablet Take 1 tablet (800 mg total) by mouth 3 (three) times daily. 03/11/14   Palumbo, April, MD  oseltamivir (TAMIFLU) 75 MG capsule Take 1 capsule (75 mg total) by mouth every 12 (twelve) hours. 08/14/18   Terrilee Files, MD  traMADol  (ULTRAM) 50 MG tablet Take 1 tablet (50 mg total) by mouth every 6 (six) hours as needed. 03/11/14   Palumbo, April, MD    Allergies    Patient has no known allergies.  Review of Systems   Review of Systems  Constitutional: Negative for chills and fever.  HENT: Negative for congestion and facial swelling.   Eyes: Negative for discharge and visual disturbance.  Respiratory: Negative for shortness of breath.   Cardiovascular: Negative for chest pain and palpitations.  Gastrointestinal: Negative for abdominal pain, diarrhea and vomiting.  Musculoskeletal: Positive for arthralgias and myalgias.  Skin: Negative for color change and rash.  Neurological: Negative for tremors, syncope and headaches.  Psychiatric/Behavioral: Negative for confusion and dysphoric mood.    Physical Exam Updated Vital Signs BP 125/75 (BP Location: Right Arm)   Pulse 86   Temp 98.1 F (36.7 C) (Oral)   Resp 17   Ht 5' 11.5" (1.816 m)   Wt 77.1 kg   SpO2 98%   BMI 23.38 kg/m   Physical Exam Vitals and nursing note reviewed.  Constitutional:      Appearance: He is well-developed.  HENT:     Head:  Normocephalic and atraumatic.  Eyes:     Pupils: Pupils are equal, round, and reactive to light.  Neck:     Vascular: No JVD.  Cardiovascular:     Rate and Rhythm: Normal rate and regular rhythm.     Heart sounds: No murmur heard.  No friction rub. No gallop.   Pulmonary:     Effort: No respiratory distress.     Breath sounds: No wheezing.  Abdominal:     General: There is no distension.     Tenderness: There is no abdominal tenderness. There is no guarding or rebound.  Musculoskeletal:        General: Tenderness present. Normal range of motion.     Cervical back: Normal range of motion and neck supple.     Comments: Pain along the medial aspects of the thigh along the hip adductor muscle.  No significant pain with range of motion of the hip.  Pulse motor and sensation intact distally.  Skin:     Coloration: Skin is not pale.     Findings: No rash.  Neurological:     Mental Status: He is alert and oriented to person, place, and time.  Psychiatric:        Behavior: Behavior normal.     ED Results / Procedures / Treatments   Labs (all labs ordered are listed, but only abnormal results are displayed) Labs Reviewed - No data to display  EKG None  Radiology No results found.  Procedures Procedures (including critical care time)  Medications Ordered in ED Medications  acetaminophen (TYLENOL) tablet 1,000 mg (has no administration in time range)  ketorolac (TORADOL) injection 15 mg (has no administration in time range)    ED Course  I have reviewed the triage vital signs and the nursing notes.  Pertinent labs & imaging results that were available during my care of the patient were reviewed by me and considered in my medical decision making (see chart for details).    MDM Rules/Calculators/A&P                          29 yo M with a chief complaints of right groin pain.  Most likely this is a groin strain based on the area of his pain on exam.  Is nontraumatic.  We will have him treat conservatively with Tylenol ibuprofen and relative rest.  PCP follow-up.  9:36 PM:  I have discussed the diagnosis/risks/treatment options with the patient and family and believe the pt to be eligible for discharge home to follow-up with PCP. We also discussed returning to the ED immediately if new or worsening sx occur. We discussed the sx which are most concerning (e.g., sudden worsening pain, fever, inability to tolerate by mouth) that necessitate immediate return. Medications administered to the patient during their visit and any new prescriptions provided to the patient are listed below.  Medications given during this visit Medications  acetaminophen (TYLENOL) tablet 1,000 mg (has no administration in time range)  ketorolac (TORADOL) injection 15 mg (has no administration in time range)      The patient appears reasonably screen and/or stabilized for discharge and I doubt any other medical condition or other [facility] requiring further screening, evaluation, or treatment in the ED at this time prior to discharge.   Final Clinical Impression(s) / ED Diagnoses Final diagnoses:  Inguinal strain, right, initial encounter    Rx / DC Orders ED Discharge Orders    None  Melene Plan, DO 06/01/20 2136

## 2020-06-01 NOTE — Discharge Instructions (Signed)
Take 4 over the counter ibuprofen tablets 3 times a day or 2 over-the-counter naproxen tablets twice a day for pain. Also take tylenol 1000mg(2 extra strength) four times a day.    

## 2022-02-28 ENCOUNTER — Encounter: Payer: Self-pay | Admitting: Family Medicine

## 2022-02-28 ENCOUNTER — Ambulatory Visit (INDEPENDENT_AMBULATORY_CARE_PROVIDER_SITE_OTHER): Payer: Managed Care, Other (non HMO) | Admitting: Family Medicine

## 2022-02-28 VITALS — BP 118/83 | HR 90 | Temp 98.1°F | Ht 71.0 in | Wt 199.1 lb

## 2022-02-28 DIAGNOSIS — Z111 Encounter for screening for respiratory tuberculosis: Secondary | ICD-10-CM | POA: Diagnosis not present

## 2022-02-28 DIAGNOSIS — Z0000 Encounter for general adult medical examination without abnormal findings: Secondary | ICD-10-CM

## 2022-02-28 LAB — LIPID PANEL
Cholesterol: 118 mg/dL (ref 0–200)
HDL: 23.2 mg/dL — ABNORMAL LOW (ref >39.00)
LDL Cholesterol: 84 mg/dL (ref 0–99)
NonHDL: 94.38
Total CHOL/HDL Ratio: 5
Triglycerides: 52 mg/dL (ref 0.0–149.0)
VLDL: 10.4 mg/dL (ref 0.0–40.0)

## 2022-02-28 LAB — COMPREHENSIVE METABOLIC PANEL
ALT: 23 U/L (ref 0–53)
AST: 19 U/L (ref 0–37)
Albumin: 4.5 g/dL (ref 3.5–5.2)
Alkaline Phosphatase: 81 U/L (ref 39–117)
BUN: 11 mg/dL (ref 6–23)
CO2: 27 mEq/L (ref 19–32)
Calcium: 9.1 mg/dL (ref 8.4–10.5)
Chloride: 105 mEq/L (ref 96–112)
Creatinine, Ser: 0.89 mg/dL (ref 0.40–1.50)
GFR: 113.05 mL/min (ref >60.00)
Glucose, Bld: 97 mg/dL (ref 70–99)
Potassium: 4 mEq/L (ref 3.5–5.1)
Sodium: 139 mEq/L (ref 135–145)
Total Bilirubin: 0.5 mg/dL (ref 0.2–1.2)
Total Protein: 6.8 g/dL (ref 6.0–8.3)

## 2022-02-28 LAB — CBC
HCT: 45.9 % (ref 39.0–52.0)
Hemoglobin: 15.9 g/dL (ref 13.0–17.0)
MCHC: 34.6 g/dL (ref 30.0–36.0)
MCV: 89.4 fl (ref 78.0–100.0)
Platelets: 242 10*3/uL (ref 150.0–400.0)
RBC: 5.13 Mil/uL (ref 4.22–5.81)
RDW: 13.8 % (ref 11.5–15.5)
WBC: 9.6 10*3/uL (ref 4.0–10.5)

## 2022-02-28 MED ORDER — CLOTRIMAZOLE-BETAMETHASONE 1-0.05 % EX CREA: 1.0000 "application " | TOPICAL_CREAM | Freq: Two times a day (BID) | CUTANEOUS | 0 refills | Status: AC

## 2022-02-28 NOTE — Patient Instructions (Addendum)
Give us 2-3 business days to get the results of your labs back.   Keep the diet clean and stay active.  Aim to do some physical exertion for 150 minutes per week. This is typically divided into 5 days per week, 30 minutes per day. The activity should be enough to get your heart rate up. Anything is better than nothing if you have time constraints.  Do monthly self testicular checks in the shower. You are feeling for lumps/bumps that don't belong. If you feel anything like this, let me know!  Please get me a copy of your advanced directive form at your convenience.   Let us know if you need anything.  

## 2022-02-28 NOTE — Progress Notes (Signed)
Chief Complaint  ?Patient presents with  ? New Patient (Initial Visit)  ? ? ?Well Male ?Clinton Kennedy is here for a complete physical.  Here w GF. ?His last physical was >1 year ago.  ?Current diet: in general, a "healthy" diet.   ?Current exercise: None ?Weight trend: Increasing a little ?Fatigue out of ordinary? No. ?Seat belt? Yes.   ?Advanced directive? No ? ?Health maintenance ?Tetanus- Yes ?HIV- Yes ?Hep C- Yes ? ?Past Medical History:  ?Diagnosis Date  ? No known health problems   ?  ? ?Past Surgical History:  ?Procedure Laterality Date  ? MOUTH SURGERY    ? ? ?Medications  ?Takes no meds routinely.  ? ?Allergies ?No Known Allergies ? ?Family History ?Family History  ?Problem Relation Age of Onset  ? Heart disease Father   ? Alcohol abuse Father   ? Diabetes Maternal Grandmother   ? Arthritis Maternal Grandmother   ? Alcohol abuse Maternal Grandmother   ? Diabetes Other   ? Stroke Other   ? ? ?Review of Systems: ?Constitutional: no fevers or chills ?Eye:  no recent significant change in vision ?Ear/Nose/Mouth/Throat:  Ears:  no hearing loss ?Nose/Mouth/Throat:  no complaints of nasal congestion, no sore throat ?Cardiovascular:  no chest pain ?Respiratory:  no shortness of breath ?Gastrointestinal:  no abdominal pain, no change in bowel habits ?GU:  Male: negative for dysuria ?Musculoskeletal/Extremities:  no pain of the joints ?Integumentary (Skin/Breast):  no new abnormal skin lesions reported ?Neurologic:  no headaches ?Endocrine: No unexpected weight changes ?Hematologic/Lymphatic:  no night sweats ? ?Exam ?BP 118/83   Pulse 90   Temp 98.1 ?F (36.7 ?C) (Oral)   Ht 5\' 11"  (1.803 m)   Wt 199 lb 2 oz (90.3 kg)   SpO2 98%   BMI 27.77 kg/m?  ?General:  well developed, well nourished, in no apparent distress ?Skin: scaly skin and papules noted over medial distal legs b/l; otherwise no significant moles, warts, or growths ?Head:  no masses, lesions, or tenderness ?Eyes:  pupils equal and round, sclera  anicteric without injection ?Ears:  canals without lesions, TMs shiny without retraction, no obvious effusion, no erythema ?Nose:  nares patent, septum midline, mucosa normal ?Throat/Pharynx:  lips and gingiva without lesion; tongue and uvula midline; non-inflamed pharynx; no exudates or postnasal drainage ?Neck: neck supple without adenopathy, thyromegaly, or masses ?Lungs:  clear to auscultation, breath sounds equal bilaterally, no respiratory distress ?Cardio:  regular rate and rhythm, no bruits, no LE edema ?Abdomen:  abdomen soft, nontender; bowel sounds normal; no masses or organomegaly ?Genital (male): Deferred ?Rectal: Deferred ?Musculoskeletal:  symmetrical muscle groups noted without atrophy or deformity ?Extremities:  no clubbing, cyanosis, or edema, no deformities, no skin discoloration ?Neuro:  gait normal; deep tendon reflexes normal and symmetric ?Psych: well oriented with normal range of affect and appropriate judgment/insight ? ?Assessment and Plan ? ?Well adult exam - Plan: CBC, Comprehensive metabolic panel, Lipid panel ? ?Screening-pulmonary TB  ? ?Well 31 y.o. male. ?Counseled on diet and exercise. ?Self testicular exams recommended at least monthly.  ?Advanced directive form provided today.  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  ?Other orders as above. ?Follow up in 1 year pending the above workup. ?The patient voiced understanding and agreement to the plan. ? ?34, DO ?02/28/22 ?11:10 AM ? ? ?

## 2022-03-02 ENCOUNTER — Ambulatory Visit: Payer: Managed Care, Other (non HMO)

## 2022-03-02 DIAGNOSIS — Z111 Encounter for screening for respiratory tuberculosis: Secondary | ICD-10-CM

## 2022-03-02 LAB — TB SKIN TEST
Induration: NEGATIVE mm
TB Skin Test: NEGATIVE

## 2022-03-02 NOTE — Progress Notes (Signed)
Patient was here for tb skin test read. Test was read as negative for induration and redness.  ?

## 2024-08-10 ENCOUNTER — Emergency Department (HOSPITAL_BASED_OUTPATIENT_CLINIC_OR_DEPARTMENT_OTHER)
Admission: EM | Admit: 2024-08-10 | Discharge: 2024-08-10 | Disposition: A | Discharge status: Home / Self Care | Payer: Self-pay

## 2024-08-10 ENCOUNTER — Other Ambulatory Visit: Payer: Self-pay

## 2024-08-10 ENCOUNTER — Emergency Department (HOSPITAL_BASED_OUTPATIENT_CLINIC_OR_DEPARTMENT_OTHER): Payer: Self-pay

## 2024-08-10 DIAGNOSIS — R21 Rash and other nonspecific skin eruption: Secondary | ICD-10-CM | POA: Insufficient documentation

## 2024-08-10 DIAGNOSIS — M25511 Pain in right shoulder: Secondary | ICD-10-CM | POA: Insufficient documentation

## 2024-08-10 DIAGNOSIS — G8929 Other chronic pain: Secondary | ICD-10-CM | POA: Insufficient documentation

## 2024-08-10 MED ORDER — TRIAMCINOLONE ACETONIDE 0.1 % EX CREA: 1.0000 | TOPICAL_CREAM | Freq: Two times a day (BID) | CUTANEOUS | 0 refills | Status: AC

## 2024-08-10 NOTE — Discharge Instructions (Addendum)
 You were seen in the ER today for evaluation of your symptoms. Given these are chronic issues, please make sure that you follow up with specialists.  The information for Dr. Emmit who is orthopedic surgeon.  Please make sure you call and schedule an appointment.  For pain, you can take Tylenol  and/or 600 mg of ibuprofen  every 6 hours as needed.  For your rash, I have prescribed you a medication called triamcinolone .  Please make sure that you are using this with a lotion to keep the skin well-hydrated.  Ultimately, you will need to see a dermatologist for this.  I have included information for one into this report.  Please make sure you call to schedule an appointment.  If you have any concerns with new or worsening symptoms, please return your nearest emerged from for evaluation.  Contact a health care provider if: You sweat at night more than normal. You pee (urinate) more or less than normal, or your pee is a darker color than normal. Your eyes become sensitive to light. Your skin or the white parts of your eyes turn yellow (jaundice). Your skin tingles or is numb. You get painful blisters in your nose or mouth. Your rash does not go away after a few days, or it gets worse. You are more tired or thirsty than normal. You have new or worse symptoms. These may include: Pain in your abdomen. Fever. Diarrhea or vomiting. Weakness or weight loss. Get help right away if: You get confused. You have a severe headache, a stiff neck, or severe joint pain or stiffness. You become very sleepy or not responsive. You have a seizure.

## 2024-08-10 NOTE — ED Triage Notes (Signed)
 Rash on ankles and wrist/hands and penis for awhile using antifungal with no relief.  Also hurt right shoulder 2 days ago and it is still sore.

## 2024-08-10 NOTE — ED Provider Notes (Signed)
  [facility] DEPARTMENT AT [facility] Provider Note   CSN: [id number] Arrival date & time: 08/10/24  8082     Patient presents with: Shoulder Pain and Rash   Clinton Kennedy is a 33 y.o. male.  {Add pertinent medical, surgical, social history, OB history to YEP:67052}  Shoulder Pain Rash      Prior to Admission medications   Medication Sig Start Date End Date Taking? Authorizing Provider  triamcinolone  cream (KENALOG ) 0.1 % Apply 1 Application topically 2 (two) times daily. 08/10/24  Yes Bernis Ernst, PA-C  clotrimazole -betamethasone  (LOTRISONE ) cream Apply 1 application. topically 2 (two) times daily. 02/28/22   Frann Mabel Mt, DO    Allergies: Patient has no known allergies.    Review of Systems  Skin:  Positive for rash.    Updated Vital Signs BP (!) 145/95 (BP Location: Right Arm)   Pulse 93   Temp 98.4 F (36.9 C) (Oral)   Resp 17   Ht 5' 11 (1.803 m)   Wt 95.3 kg   SpO2 99%   BMI 29.29 kg/m   Physical Exam  (all labs ordered are listed, but only abnormal results are displayed) Labs Reviewed - No data to display  EKG: None  Radiology: DG Shoulder Right Result Date: 08/10/2024 CLINICAL DATA:  Right shoulder pain EXAM: RIGHT SHOULDER - 2+ VIEW COMPARISON:  10/03/2012 FINDINGS: There is no evidence of fracture or dislocation. There is no evidence of arthropathy or other focal bone abnormality. Soft tissues are unremarkable. IMPRESSION: Negative. Electronically Signed   By: Franky Crease M.D.   On: 08/10/2024 21:37    {Document cardiac monitor, telemetry assessment procedure when appropriate:32947} Procedures   Medications Ordered in the ED - No data to display    {Click here for ABCD2, HEART and other calculators REFRESH Note before signing:1}                              Medical Decision Making Amount and/or Complexity of Data Reviewed Radiology: ordered.  Risk Prescription drug management.   ***  {Document  critical care time when appropriate  Document review of labs and clinical decision tools ie CHADS2VASC2, etc  Document your independent review of radiology images and any outside records  Document your discussion with family members, caretakers and with consultants  Document social determinants of health affecting pt's care  Document your decision making why or why not admission, treatments were needed:32947:::1}   Final diagnoses:  Rash  Chronic right shoulder pain    ED Discharge Orders          Ordered    triamcinolone  cream (KENALOG ) 0.1 %  2 times daily        08/10/24 2217
